# Patient Record
Sex: Female | Born: 1935 | Race: White | Hispanic: No | Smoking: Never smoker
Health system: Southern US, Community
[De-identification: ages and names within clinical notes are randomized; demographics above are authoritative.]

## PROBLEM LIST (undated history)

## (undated) DIAGNOSIS — M79673 Pain in unspecified foot: Secondary | ICD-10-CM

## (undated) DIAGNOSIS — E079 Disorder of thyroid, unspecified: Secondary | ICD-10-CM

## (undated) DIAGNOSIS — F329 Major depressive disorder, single episode, unspecified: Secondary | ICD-10-CM

## (undated) DIAGNOSIS — C439 Malignant melanoma of skin, unspecified: Secondary | ICD-10-CM

## (undated) DIAGNOSIS — G8929 Other chronic pain: Secondary | ICD-10-CM

## (undated) DIAGNOSIS — I639 Cerebral infarction, unspecified: Secondary | ICD-10-CM

## (undated) DIAGNOSIS — H7010 Chronic mastoiditis, unspecified ear: Secondary | ICD-10-CM

## (undated) DIAGNOSIS — I498 Other specified cardiac arrhythmias: Secondary | ICD-10-CM

## (undated) DIAGNOSIS — I10 Essential (primary) hypertension: Secondary | ICD-10-CM

## (undated) DIAGNOSIS — K219 Gastro-esophageal reflux disease without esophagitis: Secondary | ICD-10-CM

## (undated) DIAGNOSIS — F32A Depression, unspecified: Secondary | ICD-10-CM

## (undated) DIAGNOSIS — L309 Dermatitis, unspecified: Secondary | ICD-10-CM

## (undated) DIAGNOSIS — Z950 Presence of cardiac pacemaker: Secondary | ICD-10-CM

## (undated) DIAGNOSIS — T7840XA Allergy, unspecified, initial encounter: Secondary | ICD-10-CM

## (undated) HISTORY — DX: Malignant melanoma of skin, unspecified: C43.9

## (undated) HISTORY — DX: Other chronic pain: G89.29

## (undated) HISTORY — DX: Disorder of thyroid, unspecified: E07.9

## (undated) HISTORY — DX: Pain in unspecified foot: M79.673

## (undated) HISTORY — DX: Chronic mastoiditis, unspecified ear: H70.10

## (undated) HISTORY — DX: Other specified cardiac arrhythmias: I49.8

## (undated) HISTORY — DX: Cerebral infarction, unspecified: I63.9

## (undated) HISTORY — PX: COLON SURGERY: SHX602

## (undated) HISTORY — DX: Presence of cardiac pacemaker: Z95.0

## (undated) HISTORY — DX: Major depressive disorder, single episode, unspecified: F32.9

## (undated) HISTORY — DX: Depression, unspecified: F32.A

## (undated) HISTORY — DX: Dermatitis, unspecified: L30.9

## (undated) HISTORY — DX: Essential (primary) hypertension: I10

## (undated) HISTORY — DX: Allergy, unspecified, initial encounter: T78.40XA

## (undated) HISTORY — DX: Gastro-esophageal reflux disease without esophagitis: K21.9

---

## 1999-08-23 ENCOUNTER — Inpatient Hospital Stay (HOSPITAL_COMMUNITY)
Admission: EM | Admit: 1999-08-23 | Discharge: 1999-09-13 | Payer: Self-pay | Admitting: Physical Medicine & Rehabilitation

## 1999-09-11 ENCOUNTER — Encounter: Payer: Self-pay | Admitting: Physical Medicine & Rehabilitation

## 2000-02-21 ENCOUNTER — Emergency Department (HOSPITAL_COMMUNITY): Admission: EM | Admit: 2000-02-21 | Discharge: 2000-02-21 | Payer: Self-pay | Admitting: Emergency Medicine

## 2000-02-21 ENCOUNTER — Encounter: Payer: Self-pay | Admitting: Emergency Medicine

## 2000-05-18 ENCOUNTER — Inpatient Hospital Stay (HOSPITAL_COMMUNITY): Admission: EM | Admit: 2000-05-18 | Discharge: 2000-05-21 | Payer: Self-pay | Admitting: Neurosurgery

## 2000-05-19 ENCOUNTER — Encounter: Payer: Self-pay | Admitting: Neurosurgery

## 2000-07-15 ENCOUNTER — Other Ambulatory Visit: Admission: RE | Admit: 2000-07-15 | Discharge: 2000-07-15 | Payer: Self-pay | Admitting: Family Medicine

## 2001-04-12 ENCOUNTER — Encounter: Admission: RE | Admit: 2001-04-12 | Discharge: 2001-04-12 | Payer: Self-pay | Admitting: Oncology

## 2001-04-12 ENCOUNTER — Encounter (HOSPITAL_COMMUNITY): Admission: RE | Admit: 2001-04-12 | Discharge: 2001-05-12 | Payer: Self-pay | Admitting: Oncology

## 2001-09-25 ENCOUNTER — Emergency Department (HOSPITAL_COMMUNITY): Admission: EM | Admit: 2001-09-25 | Discharge: 2001-09-25 | Payer: Self-pay | Admitting: *Deleted

## 2001-09-29 ENCOUNTER — Ambulatory Visit (HOSPITAL_COMMUNITY): Admission: RE | Admit: 2001-09-29 | Discharge: 2001-09-29 | Payer: Self-pay | Admitting: Family Medicine

## 2001-11-01 ENCOUNTER — Encounter (HOSPITAL_COMMUNITY): Admission: RE | Admit: 2001-11-01 | Discharge: 2001-12-01 | Payer: Self-pay | Admitting: Oncology

## 2001-11-01 ENCOUNTER — Encounter: Admission: RE | Admit: 2001-11-01 | Discharge: 2001-11-01 | Payer: Self-pay | Admitting: Oncology

## 2002-05-09 ENCOUNTER — Encounter: Admission: RE | Admit: 2002-05-09 | Discharge: 2002-05-09 | Payer: Self-pay | Admitting: Oncology

## 2002-05-09 ENCOUNTER — Encounter (HOSPITAL_COMMUNITY): Admission: RE | Admit: 2002-05-09 | Discharge: 2002-06-08 | Payer: Self-pay | Admitting: Oncology

## 2002-06-10 ENCOUNTER — Encounter: Payer: Self-pay | Admitting: Internal Medicine

## 2002-06-10 ENCOUNTER — Ambulatory Visit (HOSPITAL_COMMUNITY): Admission: RE | Admit: 2002-06-10 | Discharge: 2002-06-10 | Payer: Self-pay | Admitting: Internal Medicine

## 2002-07-09 ENCOUNTER — Inpatient Hospital Stay (HOSPITAL_COMMUNITY): Admission: EM | Admit: 2002-07-09 | Discharge: 2002-07-09 | Payer: Self-pay | Admitting: Emergency Medicine

## 2002-09-15 ENCOUNTER — Encounter: Payer: Self-pay | Admitting: *Deleted

## 2002-09-15 ENCOUNTER — Emergency Department (HOSPITAL_COMMUNITY): Admission: EM | Admit: 2002-09-15 | Discharge: 2002-09-15 | Payer: Self-pay

## 2002-10-20 ENCOUNTER — Ambulatory Visit (HOSPITAL_COMMUNITY): Admission: RE | Admit: 2002-10-20 | Discharge: 2002-10-20 | Payer: Self-pay | Admitting: Family Medicine

## 2002-10-20 ENCOUNTER — Encounter: Payer: Self-pay | Admitting: Family Medicine

## 2002-10-31 ENCOUNTER — Ambulatory Visit (HOSPITAL_COMMUNITY): Admission: RE | Admit: 2002-10-31 | Discharge: 2002-10-31 | Payer: Self-pay | Admitting: Internal Medicine

## 2002-11-07 ENCOUNTER — Encounter (HOSPITAL_COMMUNITY): Admission: RE | Admit: 2002-11-07 | Discharge: 2002-12-07 | Payer: Self-pay | Admitting: Oncology

## 2002-11-07 ENCOUNTER — Encounter: Admission: RE | Admit: 2002-11-07 | Discharge: 2002-11-07 | Payer: Self-pay | Admitting: Oncology

## 2002-12-02 ENCOUNTER — Encounter: Payer: Self-pay | Admitting: Family Medicine

## 2002-12-02 ENCOUNTER — Ambulatory Visit (HOSPITAL_COMMUNITY): Admission: RE | Admit: 2002-12-02 | Discharge: 2002-12-02 | Payer: Self-pay | Admitting: Family Medicine

## 2002-12-19 ENCOUNTER — Emergency Department (HOSPITAL_COMMUNITY): Admission: EM | Admit: 2002-12-19 | Discharge: 2002-12-19 | Payer: Self-pay | Admitting: Emergency Medicine

## 2002-12-19 ENCOUNTER — Encounter: Payer: Self-pay | Admitting: Emergency Medicine

## 2003-03-10 ENCOUNTER — Ambulatory Visit (HOSPITAL_COMMUNITY): Admission: RE | Admit: 2003-03-10 | Discharge: 2003-03-10 | Payer: Self-pay | Admitting: Family Medicine

## 2003-03-10 ENCOUNTER — Encounter: Payer: Self-pay | Admitting: Family Medicine

## 2003-04-25 ENCOUNTER — Ambulatory Visit (HOSPITAL_COMMUNITY): Admission: RE | Admit: 2003-04-25 | Discharge: 2003-04-25 | Payer: Self-pay | Admitting: Family Medicine

## 2003-04-25 ENCOUNTER — Encounter: Payer: Self-pay | Admitting: Family Medicine

## 2003-05-02 ENCOUNTER — Ambulatory Visit (HOSPITAL_COMMUNITY): Admission: RE | Admit: 2003-05-02 | Discharge: 2003-05-02 | Payer: Self-pay | Admitting: Family Medicine

## 2003-05-02 ENCOUNTER — Encounter: Payer: Self-pay | Admitting: Family Medicine

## 2003-05-10 ENCOUNTER — Encounter: Admission: RE | Admit: 2003-05-10 | Discharge: 2003-05-10 | Payer: Self-pay | Admitting: Oncology

## 2003-07-10 ENCOUNTER — Ambulatory Visit (HOSPITAL_COMMUNITY): Admission: RE | Admit: 2003-07-10 | Discharge: 2003-07-10 | Payer: Self-pay | Admitting: Internal Medicine

## 2003-08-04 ENCOUNTER — Inpatient Hospital Stay (HOSPITAL_COMMUNITY): Admission: EM | Admit: 2003-08-04 | Discharge: 2003-08-05 | Payer: Self-pay | Admitting: Emergency Medicine

## 2003-08-09 ENCOUNTER — Ambulatory Visit (HOSPITAL_COMMUNITY): Admission: RE | Admit: 2003-08-09 | Discharge: 2003-08-09 | Payer: Self-pay | Admitting: Family Medicine

## 2003-08-19 ENCOUNTER — Inpatient Hospital Stay (HOSPITAL_COMMUNITY): Admission: EM | Admit: 2003-08-19 | Discharge: 2003-08-22 | Payer: Self-pay | Admitting: Emergency Medicine

## 2003-09-05 ENCOUNTER — Encounter: Payer: Self-pay | Admitting: Internal Medicine

## 2003-12-11 ENCOUNTER — Encounter: Admission: RE | Admit: 2003-12-11 | Discharge: 2003-12-11 | Payer: Self-pay | Admitting: Oncology

## 2003-12-11 ENCOUNTER — Encounter (HOSPITAL_COMMUNITY): Admission: RE | Admit: 2003-12-11 | Discharge: 2004-01-10 | Payer: Self-pay | Admitting: Oncology

## 2004-02-06 ENCOUNTER — Ambulatory Visit (HOSPITAL_COMMUNITY): Admission: RE | Admit: 2004-02-06 | Discharge: 2004-02-06 | Payer: Self-pay | Admitting: Family Medicine

## 2004-02-16 ENCOUNTER — Encounter (HOSPITAL_COMMUNITY): Admission: RE | Admit: 2004-02-16 | Discharge: 2004-03-17 | Payer: Self-pay | Admitting: Family Medicine

## 2004-04-04 ENCOUNTER — Ambulatory Visit (HOSPITAL_COMMUNITY): Admission: RE | Admit: 2004-04-04 | Discharge: 2004-04-04 | Payer: Self-pay | Admitting: Family Medicine

## 2004-06-11 ENCOUNTER — Encounter: Admission: RE | Admit: 2004-06-11 | Discharge: 2004-06-11 | Payer: Self-pay | Admitting: Oncology

## 2004-06-11 ENCOUNTER — Encounter (HOSPITAL_COMMUNITY): Admission: RE | Admit: 2004-06-11 | Discharge: 2004-07-11 | Payer: Self-pay | Admitting: Oncology

## 2004-12-10 ENCOUNTER — Ambulatory Visit (HOSPITAL_COMMUNITY): Payer: Self-pay | Admitting: Oncology

## 2004-12-10 ENCOUNTER — Encounter (HOSPITAL_COMMUNITY): Admission: RE | Admit: 2004-12-10 | Discharge: 2005-01-09 | Payer: Self-pay | Admitting: Oncology

## 2004-12-10 ENCOUNTER — Encounter: Admission: RE | Admit: 2004-12-10 | Discharge: 2004-12-10 | Payer: Self-pay | Admitting: Oncology

## 2005-05-10 ENCOUNTER — Emergency Department (HOSPITAL_COMMUNITY): Admission: EM | Admit: 2005-05-10 | Discharge: 2005-05-10 | Payer: Self-pay | Admitting: Emergency Medicine

## 2005-06-02 ENCOUNTER — Ambulatory Visit: Payer: Self-pay | Admitting: Internal Medicine

## 2005-06-11 ENCOUNTER — Encounter (HOSPITAL_COMMUNITY): Admission: RE | Admit: 2005-06-11 | Discharge: 2005-07-11 | Payer: Self-pay | Admitting: Oncology

## 2005-06-11 ENCOUNTER — Ambulatory Visit (HOSPITAL_COMMUNITY): Payer: Self-pay | Admitting: Oncology

## 2005-06-11 ENCOUNTER — Encounter: Admission: RE | Admit: 2005-06-11 | Discharge: 2005-06-11 | Payer: Self-pay | Admitting: Oncology

## 2005-06-13 ENCOUNTER — Ambulatory Visit (HOSPITAL_COMMUNITY): Admission: RE | Admit: 2005-06-13 | Discharge: 2005-06-13 | Payer: Self-pay | Admitting: Family Medicine

## 2005-07-15 ENCOUNTER — Inpatient Hospital Stay (HOSPITAL_COMMUNITY): Admission: EM | Admit: 2005-07-15 | Discharge: 2005-07-16 | Payer: Self-pay | Admitting: Emergency Medicine

## 2005-08-11 ENCOUNTER — Ambulatory Visit (HOSPITAL_COMMUNITY): Admission: RE | Admit: 2005-08-11 | Discharge: 2005-08-11 | Payer: Self-pay | Admitting: Internal Medicine

## 2005-11-14 ENCOUNTER — Ambulatory Visit (HOSPITAL_COMMUNITY): Admission: RE | Admit: 2005-11-14 | Discharge: 2005-11-14 | Payer: Self-pay | Admitting: Internal Medicine

## 2005-11-25 ENCOUNTER — Ambulatory Visit (HOSPITAL_COMMUNITY): Admission: RE | Admit: 2005-11-25 | Discharge: 2005-11-25 | Payer: Self-pay | Admitting: Family Medicine

## 2005-12-11 ENCOUNTER — Ambulatory Visit (HOSPITAL_COMMUNITY): Payer: Self-pay | Admitting: Oncology

## 2005-12-11 ENCOUNTER — Encounter: Admission: RE | Admit: 2005-12-11 | Discharge: 2005-12-11 | Payer: Self-pay | Admitting: Oncology

## 2005-12-11 ENCOUNTER — Encounter (HOSPITAL_COMMUNITY): Admission: RE | Admit: 2005-12-11 | Discharge: 2006-01-10 | Payer: Self-pay | Admitting: Oncology

## 2006-04-22 ENCOUNTER — Ambulatory Visit: Payer: Self-pay | Admitting: Internal Medicine

## 2006-05-11 ENCOUNTER — Encounter: Admission: RE | Admit: 2006-05-11 | Discharge: 2006-05-29 | Payer: Self-pay | Admitting: Oncology

## 2006-05-28 ENCOUNTER — Ambulatory Visit: Payer: Self-pay | Admitting: Internal Medicine

## 2006-06-10 ENCOUNTER — Encounter (HOSPITAL_COMMUNITY): Admission: RE | Admit: 2006-06-10 | Discharge: 2006-07-10 | Payer: Self-pay | Admitting: Oncology

## 2006-06-10 ENCOUNTER — Encounter: Admission: RE | Admit: 2006-06-10 | Discharge: 2006-06-10 | Payer: Self-pay | Admitting: Oncology

## 2006-06-10 ENCOUNTER — Ambulatory Visit (HOSPITAL_COMMUNITY): Payer: Self-pay | Admitting: Oncology

## 2006-07-06 ENCOUNTER — Ambulatory Visit (HOSPITAL_COMMUNITY): Admission: RE | Admit: 2006-07-06 | Discharge: 2006-07-06 | Payer: Self-pay | Admitting: Internal Medicine

## 2006-07-06 ENCOUNTER — Ambulatory Visit: Payer: Self-pay | Admitting: Internal Medicine

## 2006-09-29 ENCOUNTER — Observation Stay (HOSPITAL_COMMUNITY): Admission: EM | Admit: 2006-09-29 | Discharge: 2006-09-29 | Payer: Self-pay | Admitting: Emergency Medicine

## 2006-10-22 ENCOUNTER — Ambulatory Visit (HOSPITAL_COMMUNITY): Admission: RE | Admit: 2006-10-22 | Discharge: 2006-10-22 | Payer: Self-pay | Admitting: Family Medicine

## 2006-12-09 ENCOUNTER — Encounter (HOSPITAL_COMMUNITY): Admission: RE | Admit: 2006-12-09 | Discharge: 2007-01-08 | Payer: Self-pay | Admitting: Oncology

## 2006-12-09 ENCOUNTER — Ambulatory Visit (HOSPITAL_COMMUNITY): Payer: Self-pay | Admitting: Oncology

## 2007-03-16 ENCOUNTER — Ambulatory Visit (HOSPITAL_COMMUNITY): Admission: RE | Admit: 2007-03-16 | Discharge: 2007-03-16 | Payer: Self-pay | Admitting: Family Medicine

## 2007-04-14 ENCOUNTER — Ambulatory Visit (HOSPITAL_COMMUNITY): Admission: RE | Admit: 2007-04-14 | Discharge: 2007-04-14 | Payer: Self-pay | Admitting: Family Medicine

## 2007-05-07 ENCOUNTER — Encounter (HOSPITAL_COMMUNITY): Admission: RE | Admit: 2007-05-07 | Discharge: 2007-06-01 | Payer: Self-pay | Admitting: Family Medicine

## 2007-05-16 ENCOUNTER — Ambulatory Visit: Payer: Self-pay | Admitting: Cardiology

## 2007-05-18 ENCOUNTER — Inpatient Hospital Stay (HOSPITAL_COMMUNITY): Admission: EM | Admit: 2007-05-18 | Discharge: 2007-05-20 | Payer: Self-pay | Admitting: Emergency Medicine

## 2007-05-20 ENCOUNTER — Ambulatory Visit: Payer: Self-pay | Admitting: Internal Medicine

## 2007-06-28 ENCOUNTER — Encounter (HOSPITAL_COMMUNITY): Admission: RE | Admit: 2007-06-28 | Discharge: 2007-07-28 | Payer: Self-pay | Admitting: Family Medicine

## 2007-06-29 ENCOUNTER — Ambulatory Visit (HOSPITAL_COMMUNITY): Payer: Self-pay | Admitting: Oncology

## 2007-06-29 ENCOUNTER — Encounter (HOSPITAL_COMMUNITY): Admission: RE | Admit: 2007-06-29 | Discharge: 2007-07-29 | Payer: Self-pay | Admitting: Oncology

## 2007-07-24 ENCOUNTER — Emergency Department (HOSPITAL_COMMUNITY): Admission: EM | Admit: 2007-07-24 | Discharge: 2007-07-24 | Payer: Self-pay | Admitting: Emergency Medicine

## 2007-08-09 ENCOUNTER — Encounter (HOSPITAL_COMMUNITY): Admission: RE | Admit: 2007-08-09 | Discharge: 2007-09-01 | Payer: Self-pay | Admitting: Family Medicine

## 2007-09-03 ENCOUNTER — Emergency Department (HOSPITAL_COMMUNITY): Admission: EM | Admit: 2007-09-03 | Discharge: 2007-09-03 | Payer: Self-pay | Admitting: Emergency Medicine

## 2007-12-09 ENCOUNTER — Ambulatory Visit (HOSPITAL_COMMUNITY): Admission: RE | Admit: 2007-12-09 | Discharge: 2007-12-09 | Payer: Self-pay | Admitting: Internal Medicine

## 2008-05-05 ENCOUNTER — Encounter: Payer: Self-pay | Admitting: Internal Medicine

## 2008-07-17 ENCOUNTER — Emergency Department: Payer: Self-pay | Admitting: Emergency Medicine

## 2008-07-20 IMAGING — NM NM MYOCAR MULTI W/ SPECT
1 series · 6 of 6 positions shown · non-contrast
Comparison: none

CLINICAL DATA: 82-year-old patient admitted to the hospital with a fall.   Positive troponins.  Rule out coronary disease. 
 STRESS MYOVIEW:
 140 mcg/kg/minute of Adenosine infused over 4 minutes.  Heart rate was relatively fixed with atrial pacing.  Baseline EKG showed T-wave inversions in the anterior lateral leads.  Patient tolerated the procedure well.  Blood pressure was stable at 120/70. 
 SCINTIGRAPHIC RESULTS:  
 Images were reconstructed in the short axis, vertical, and horizontal axis.  There was some thinning of the inferior apex suggesting a possible small inferior apical wall infarction.  There was no evidence of ischemia.  LV cavity size and function were normal.

[Series 1: cr cardiac tc low dose · 6.52mm/px · 6 of 64 frames shown]
[frame 6/64]
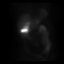
[frame 16/64]
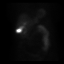
[frame 27/64]
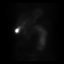
[frame 38/64]
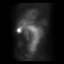
[frame 48/64]
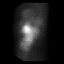
[frame 59/64]
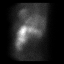

[6 of 6 positions shown; findings below may reference images not displayed]

IMPRESSION: Low risk Myoview study possibly small inferior apical wall infarction.  No evidence of ischemia.  Gated ejection fraction preserved at 59%.  Note should be made that the patient did have pacing during the study.

## 2008-08-22 ENCOUNTER — Emergency Department: Payer: Self-pay | Admitting: Emergency Medicine

## 2008-10-12 ENCOUNTER — Emergency Department: Payer: Self-pay | Admitting: Emergency Medicine

## 2008-11-01 ENCOUNTER — Emergency Department: Payer: Self-pay | Admitting: Emergency Medicine

## 2008-12-05 ENCOUNTER — Emergency Department: Payer: Self-pay | Admitting: Emergency Medicine

## 2008-12-21 ENCOUNTER — Encounter (INDEPENDENT_AMBULATORY_CARE_PROVIDER_SITE_OTHER): Payer: Self-pay | Admitting: *Deleted

## 2008-12-27 ENCOUNTER — Ambulatory Visit: Payer: Self-pay | Admitting: Specialist

## 2008-12-27 ENCOUNTER — Ambulatory Visit: Payer: Self-pay | Admitting: Internal Medicine

## 2009-02-27 DIAGNOSIS — Z950 Presence of cardiac pacemaker: Secondary | ICD-10-CM

## 2009-02-27 DIAGNOSIS — I498 Other specified cardiac arrhythmias: Secondary | ICD-10-CM

## 2009-02-27 DIAGNOSIS — I10 Essential (primary) hypertension: Secondary | ICD-10-CM | POA: Insufficient documentation

## 2009-02-28 ENCOUNTER — Encounter (HOSPITAL_COMMUNITY): Admission: RE | Admit: 2009-02-28 | Discharge: 2009-03-30 | Payer: Self-pay | Admitting: Oncology

## 2009-02-28 ENCOUNTER — Ambulatory Visit (HOSPITAL_COMMUNITY): Payer: Self-pay | Admitting: Oncology

## 2009-03-02 ENCOUNTER — Encounter: Payer: Self-pay | Admitting: Internal Medicine

## 2009-10-05 ENCOUNTER — Emergency Department: Payer: Self-pay | Admitting: Emergency Medicine

## 2009-11-30 ENCOUNTER — Ambulatory Visit: Payer: Self-pay | Admitting: Family

## 2010-01-15 ENCOUNTER — Emergency Department: Payer: Self-pay | Admitting: Emergency Medicine

## 2010-02-28 ENCOUNTER — Ambulatory Visit: Payer: Self-pay | Admitting: Cardiology

## 2010-03-07 ENCOUNTER — Ambulatory Visit: Payer: Self-pay | Admitting: Cardiology

## 2010-03-28 ENCOUNTER — Encounter: Payer: Self-pay | Admitting: Family Medicine

## 2010-04-29 ENCOUNTER — Encounter (INDEPENDENT_AMBULATORY_CARE_PROVIDER_SITE_OTHER): Payer: Self-pay | Admitting: *Deleted

## 2010-05-30 ENCOUNTER — Encounter: Payer: Self-pay | Admitting: Internal Medicine

## 2010-06-12 ENCOUNTER — Emergency Department: Payer: Self-pay | Admitting: Emergency Medicine

## 2010-06-28 ENCOUNTER — Encounter (INDEPENDENT_AMBULATORY_CARE_PROVIDER_SITE_OTHER): Payer: Self-pay | Admitting: *Deleted

## 2010-09-21 ENCOUNTER — Encounter: Payer: Self-pay | Admitting: General Surgery

## 2010-09-21 ENCOUNTER — Encounter: Payer: Self-pay | Admitting: *Deleted

## 2010-09-22 ENCOUNTER — Encounter: Payer: Self-pay | Admitting: Family Medicine

## 2010-10-01 NOTE — Letter (Signed)
Summary: Device-Delinquent Check    [address], [place] [address]   Phone: [phone]  Fax: [phone]     April 29, 2010 MRN: [id number]   Brooke [name] [address] [place], [place]  [address]   Dear [name],  According to our records, you have not had your implanted device checked in the recommended period of time.  We are unable to determine appropriate device function without checking your device on a regular basis.  Please call our office to schedule an appointment, with Dr. Ladona Ridgel in [place],  as soon as possible.  If you are having your device checked by another physician, please call us so that we may update our records.  Thank you,  Altha Harm, LPN  April 29, 2010 8:36 AM  [facility]  certified mail

## 2010-10-01 NOTE — Cardiovascular Report (Signed)
Summary: Certified Letter - Returned  Certified Letter - Returned   Imported By: Debby Freiberg 07/10/2010 14:36:49  _____________________________________________________________________  External Attachment:    Type:   Image     Comment:   External Document

## 2010-10-01 NOTE — Miscellaneous (Signed)
Summary: dx correction  Clinical Lists Changes  Problems: Changed problem from PACEMAKER (ICD-V45..01) to PACEMAKER, PERMANENT (ICD-V45.01)  changed the incorrect dx code to correct dx code 

## 2010-10-08 ENCOUNTER — Encounter: Payer: Self-pay | Admitting: Family Medicine

## 2010-10-08 ENCOUNTER — Encounter (INDEPENDENT_AMBULATORY_CARE_PROVIDER_SITE_OTHER): Payer: Medicare Other | Admitting: Family Medicine

## 2010-10-08 ENCOUNTER — Other Ambulatory Visit: Payer: Self-pay | Admitting: Family Medicine

## 2010-10-08 DIAGNOSIS — B369 Superficial mycosis, unspecified: Secondary | ICD-10-CM | POA: Insufficient documentation

## 2010-10-08 DIAGNOSIS — K219 Gastro-esophageal reflux disease without esophagitis: Secondary | ICD-10-CM | POA: Insufficient documentation

## 2010-10-08 DIAGNOSIS — J309 Allergic rhinitis, unspecified: Secondary | ICD-10-CM | POA: Insufficient documentation

## 2010-10-08 DIAGNOSIS — I10 Essential (primary) hypertension: Secondary | ICD-10-CM

## 2010-10-08 DIAGNOSIS — E039 Hypothyroidism, unspecified: Secondary | ICD-10-CM

## 2010-10-08 DIAGNOSIS — I2510 Atherosclerotic heart disease of native coronary artery without angina pectoris: Secondary | ICD-10-CM

## 2010-10-08 DIAGNOSIS — M810 Age-related osteoporosis without current pathological fracture: Secondary | ICD-10-CM

## 2010-10-08 DIAGNOSIS — F329 Major depressive disorder, single episode, unspecified: Secondary | ICD-10-CM | POA: Insufficient documentation

## 2010-10-08 DIAGNOSIS — B0229 Other postherpetic nervous system involvement: Secondary | ICD-10-CM | POA: Insufficient documentation

## 2010-10-08 DIAGNOSIS — Z8679 Personal history of other diseases of the circulatory system: Secondary | ICD-10-CM | POA: Insufficient documentation

## 2010-10-08 DIAGNOSIS — M79609 Pain in unspecified limb: Secondary | ICD-10-CM | POA: Insufficient documentation

## 2010-10-08 DIAGNOSIS — E785 Hyperlipidemia, unspecified: Secondary | ICD-10-CM

## 2010-10-08 LAB — CBC WITH DIFFERENTIAL/PLATELET
Basophils Relative: 0.5 % (ref 0.0–3.0)
HCT: 36 % (ref 36.0–46.0)
Monocytes Relative: 9.5 % (ref 3.0–12.0)
RDW: 13.6 % (ref 11.5–14.6)
WBC: 4.9 10*3/uL (ref 4.5–10.5)

## 2010-10-08 LAB — HEPATIC FUNCTION PANEL
ALT: 17 U/L (ref 0–35)
AST: 34 U/L (ref 0–37)
Alkaline Phosphatase: 60 U/L (ref 39–117)
Bilirubin, Direct: 0.1 mg/dL (ref 0.0–0.3)
Total Bilirubin: 0.5 mg/dL (ref 0.3–1.2)

## 2010-10-08 LAB — LDL CHOLESTEROL, DIRECT: Direct LDL: 121.4 mg/dL

## 2010-10-08 LAB — RENAL FUNCTION PANEL
CO2: 32 mEq/L (ref 19–32)
Creatinine, Ser: 0.8 mg/dL (ref 0.4–1.2)
Potassium: 3.8 mEq/L (ref 3.5–5.1)

## 2010-10-08 LAB — LIPID PANEL
Cholesterol: 206 mg/dL — ABNORMAL HIGH (ref 0–200)
Total CHOL/HDL Ratio: 3
Triglycerides: 134 mg/dL (ref 0.0–149.0)

## 2010-10-08 LAB — TSH: TSH: 1.97 u[IU]/mL (ref 0.35–5.50)

## 2010-10-11 LAB — CONVERTED CEMR LAB: Vit D, 25-Hydroxy: 53 ng/mL (ref 30–89)

## 2010-10-17 NOTE — Assessment & Plan Note (Signed)
Summary: pt to re-establish/dlo   Vital Signs:  Patient profile:   77 year old female Height:      58 inches Weight:      112.50 pounds BMI:     23.60 Temp:     97.6 degrees F oral Pulse rate:   72 / minute Pulse rhythm:   regular BP sitting:   128 / 76  (left arm) Cuff size:   regular  Vitals Entered By: Lewanda Rife LPN (October 08, 2010 11:29 AM) CC: to reestablish   History of Present Illness: moved back to [facility] - to be closer to family  saw Dr Sullivan Lone for a while -- she did not have good trust with him   has had bradycardia and pacemaker since last visit doing well with that   is having problems with her feet  hurts to walk on them  her last doctor took the "wrong bone " out of toe -- at [facility]  then saw Dr Orland Jarred in [place] - removed corn from her R foot - that was painful  is wearing good shoes   is walking with a walker because of feet   rash on breast -- is really itchy  it is burning  has had the rash for a year  ? from chemo after her colon cancer   is taking thyroid medicine  has not checked labs for a long time    Preventive Screening-Counseling & Management  Caffeine-Diet-Exercise     Does Patient Exercise: no  Allergies (verified): 1)  ! Penicillin 2)  ! Sulfa 3)  ! Tretinoin (Chemotherapy) (Tretinoin (Chemotherapy)) 4)  ! Darvocet 5)  ! Pneumovax 23 (Pneumococcal Vac Polyvalent)  Past History:  Family History: Last updated: 10/08/2010 Father: alcoholism heart disease brother Alcoholism  Social History: Last updated: 10/08/2010 Widowed  retired  moved to [place] - closer to family  Tobacco Use - No.  Alcohol Use - no Drug Use - no Regular exercise-no son Kathlene November takes care of him  lives in senior apartments that she really likes   Risk Factors: Exercise: no (10/08/2010)  Risk Factors: Smoking Status: never (02/27/2009)  Past Medical History: BRADYCARDIA (ICD-427.89) HYPERTENSION, UNSPECIFIED  (ICD-401.9) PACEMAKER (ICD-V45.Marland Kitchen01) Thyroid problem Allergic rhinitis Depression GERD Cerebrovascular accident, hx of past hx of colon cancer osteoporosis  ? chronic mastoiditis  seasonal all rhinitis  eczema  hearing loss - with hearing aides  chronic foot pain  ? melanoma - pt disputes this    [facility]ar[facility]- [facility] - Dr Hermelinda Medicus cardiology- Ladona Ridgel for pacer , sees Dr Gwen Pounds for general cardiol  Past Surgical History: PCM -  [employer]  Kappa 900 - 03/2001 Colon CA with surgery 2000 Stage 4 Melanoma surgery at [facility] 2010  Family History: Father: alcoholism heart disease brother Alcoholism  Social History: Widowed  retired  moved to [place] - closer to family  Tobacco Use - No.  Alcohol Use - no Drug Use - no Regular exercise-no son Kathlene November takes care of him  lives in senior apartments that she really likes  Does Patient Exercise:  no  Review of Systems General:  Complains of fatigue; denies chills, fever, loss of appetite, and malaise. Eyes:  Denies blurring and eye irritation. ENT:  Denies sinus pressure and sore throat. CV:  Denies chest pain or discomfort, lightheadness, and palpitations. Resp:  Denies cough, shortness of breath, and wheezing. GI:  Denies abdominal pain, change in bowel habits, hemorrhoids, and indigestion. GU:  Denies urinary frequency. MS:  Complains of joint pain and stiffness. Derm:  Denies itching, lesion(s), poor wound healing, and rash. Neuro:  Denies numbness and tingling. Psych:  Denies anxiety and depression. Endo:  Denies cold intolerance, excessive thirst, excessive urination, and heat intolerance. Heme:  Denies abnormal bruising and bleeding.  Physical Exam  General:  frail appearing elderly female with walker  Head:  normocephalic, atraumatic, and no abnormalities observed.   Eyes:  vision grossly intact, pupils equal, pupils round, pupils reactive to light, and no injection.   Ears:  R ear normal and L ear  normal.   is hard of hearing despite hearing aids -- have to yell to be heard  Nose:  no nasal discharge.   Mouth:  pharynx pink and moist.   Neck:  supple with full rom and no masses or thyromegally, no JVD or carotid bruit  Chest Wall:  No deformities, masses, or tenderness  pacer noted  Breasts:  no lumps vague light redness under R breast without satellite lesions also some faint scarring from old pinpoint/ papule like lesions? (looks like healed shingles) Lungs:  scant distant bs and crackles at bases good air exch  no wheeze Heart:  RRR no M Abdomen:  protuberant and soft and nt no renal bruits  Msk:  No deformity or scoliosis noted of thoracic or lumbar spine.  no acute joint changes  bilateral foot deformity with first toe overlapping 2nd  slow gait  bunions and callouses noted  Pulses:  R and L carotid,radial,femoral,dorsalis pedis and posterior tibial pulses are full and equal bilaterally Extremities:  No clubbing, cyanosis, edema, or deformity noted with normal full range of motion of all joints.   Neurologic:  sensation intact to light touch and DTRs symmetrical and normal.   Skin:  see breast exam no other rash or skin changes  Cervical Nodes:  No lymphadenopathy noted Inguinal Nodes:  No significant adenopathy Psych:  normal affect, talkative and pleasant    Impression & Recommendations:  Problem # 1:  POSTHERPETIC NEURALGIA (ICD-053.19) Assessment New I have not doubt that pt had shingles under her R breast 1 year ago and now is suffering from post herpetic neuralgia in addition - some redness that could be yeast will tx with nystatin  pt not interested in further tx for pain but I did give her a handout from [facility] on shingles and phn-- we will discuss shingles vaccine in future   Problem # 2:  FOOT PAIN, CHRONIC (ICD-729.5) Assessment: New much deformity/ toe overlap and arthritis few bunions  will refer to pod to disc options for shoe wear that would be more  comfortable Orders: Podiatry Referral (Podiatry)  Problem # 3:  HYPOTHYROIDISM (ICD-244.9) Assessment: New no clinical changes, but this is new per pt - has not been checked since starting med tsh today and update Her updated medication list for this problem includes:    Levothroid 75 Mcg Tabs (Levothyroxine sodium) .Marland Kitchen... 1/2 tablet by mouth daily  Orders: Venipuncture ([address]) TLB-Lipid Panel (80061-LIPID) TLB-Renal Function Panel (80069-RENAL) TLB-CBC Platelet - w/Differential (85025-CBCD) TLB-Hepatic/Liver Function Pnl (80076-HEPATIC) TLB-TSH (Thyroid Stimulating Hormone) (84443-TSH) T-Vitamin D (25-Hydroxy) ([address]11)  Problem # 4:  OSTEOPOROSIS (ICD-733.00) Assessment: New pt thinks she is up to date on dexa- unsure  will send for that  also disc ca and D currently on no meds will disc further at f/u Orders: Venipuncture (91478) TLB-Lipid Panel (80061-LIPID) TLB-Renal Function Panel (80069-RENAL) TLB-CBC Platelet - w/Differential (85025-CBCD) TLB-Hepatic/Liver Function Pnl (80076-HEPATIC) TLB-TSH (Thyroid Stimulating Hormone) (84443-TSH)  T-Vitamin D (25-Hydroxy) [phone])  Problem # 5:  CORONARY ARTERY DISEASE (ICD-414.00) Assessment: New per pt this is in good control after pacemaker with no problems sent for last cardiol note also checking lipids and also renal in light of diuretics  Her updated medication list for this problem includes:    Aspirin 81 Mg Tabs (Aspirin) .Marland Kitchen... Take 1 tablet by mouth once a day    Furosemide 20 Mg Tabs (Furosemide) .Marland Kitchen... Take 1 tablet by mouth once a day as needed for fluid    Hydrochlorothiazide 25 Mg Tabs (Hydrochlorothiazide) .Marland Kitchen... Take 1 tablet by mouth once a day    Metoprolol Succinate 25 Mg Xr24h-tab (Metoprolol succinate) .Marland Kitchen... 1/2 tablet by mouth daily at dinner  Orders: Venipuncture ([address]) TLB-Lipid Panel (80061-LIPID) TLB-Renal Function Panel (80069-RENAL) TLB-CBC Platelet - w/Differential  (85025-CBCD) TLB-Hepatic/Liver Function Pnl (80076-HEPATIC) TLB-TSH (Thyroid Stimulating Hormone) (84443-TSH) T-Vitamin D (25-Hydroxy) ([address]-29[address])  Problem # 6:  PACEMAKER, PERMANENT (ICD-V45.01) Assessment: Comment Only rev notes from Dr Ladona Ridgel sounds like good LV function   Problem # 7:  Preventive Health Care (ICD-V70.0) Assessment: Comment Only sent for last primary care note/ labs/ mam and dexa and imms  will rev at f/u  Complete Medication List: 1)  Multivitamins Tabs (Multiple vitamin) .... Take 1 tablet by mouth once a day 2)  Ibuprofen 200 Mg Tabs (Ibuprofen) .... Otc as directed. 3)  Aspirin 81 Mg Tabs (Aspirin) .... Take 1 tablet by mouth once a day 4)  Clotrimazole 1 % Crea (Clotrimazole) .... As needed for rash 5)  Loratadine 10 Mg Tabs (Loratadine) .... Take 1 tablet by mouth once a day 6)  Furosemide 20 Mg Tabs (Furosemide) .... Take 1 tablet by mouth once a day as needed for fluid 7)  Klor-con 10 10 Meq Cr-tabs (Potassium chloride) .... Take 1 tablet by mouth once a day as needed when take furosemide 8)  Meclizine Hcl 25 Mg Tabs (Meclizine hcl) .... One tablet by mouth twice a day as needed for dizziness 9)  Hydrochlorothiazide 25 Mg Tabs (Hydrochlorothiazide) .... Take 1 tablet by mouth once a day 10)  Metoprolol Succinate 25 Mg Xr24h-tab (Metoprolol succinate) .... 1/2 tablet by mouth daily at dinner 11)  Levothroid 75 Mcg Tabs (Levothyroxine sodium) .... 1/2 tablet by mouth daily 12)  Nystatin 100000 Unit/gm Crea (Nystatin) .... Apply to affected area under breast for rash two times a day fprn  Other Orders: Prescription Created Electronically [id number])  Patient Instructions: 1)  I think you had shingles a year ago under breast that has caused burning (called post herpetic neuralgia )  2)  also you may have some yeast - so try the nystatin cream and keep the area cool and dry  3)  please send to Dr Sullivan Lone for last progress note 4)  also last mammogram and dexa  and immunizations  5)  please send to Dr Gwen Pounds for last cardiology progress note  6)  labs today 7)  follow up with me in 1 -2 months - we will review your labs  8)  we will refer you to Dr Al Corpus (podiatrist ) at check out  Prescriptions: NYSTATIN [id number] UNIT/GM CREA (NYSTATIN) apply to affected area under breast for rash two times a day fprn  #1 medium x 0   Entered and Authorized by:   Judith Part MD   Signed by:   Judith Part MD on 10/08/2010   Method used:   [facility]ronically to        [facility]       [address]       [place], [place]  [address]       Ph: [phone]       Fax: [phone]   RxID:   [id number]    Orders Added: 1)  Venipuncture [36415] 2)  TLB-Lipid Panel [80061-LIPID] 3)  TLB-Renal Function Panel [80069-RENAL] 4)  TLB-CBC Platelet - w/Differential [85025-CBCD] 5)  TLB-Hepatic/Liver Function Pnl [80076-HEPATIC] 6)  TLB-TSH (Thyroid Stimulating Hormone) [84443-TSH] 7)  T-Vitamin D (25-Hydroxy) [84132-44010] 8)  Podiatry Referral [Podiatry] 9)  Prescription Created Electronically [G8553] 10)  New Patient Level IV [27253]    Current Allergies (reviewed today): ! PENICILLIN ! SULFA ! TRETINOIN (CHEMOTHERAPY) (TRETINOIN (CHEMOTHERAPY)) ! DARVOCET ! PNEUMOVAX 23 (PNEUMOCOCCAL VAC POLYVALENT)

## 2010-10-17 NOTE — Letter (Signed)
Summary: Patient Questionnaire  Patient Questionnaire   Imported By: Beau Fanny 10/08/2010 15:07:11  _____________________________________________________________________  External Attachment:    Type:   Image     Comment:   External Document

## 2010-10-23 ENCOUNTER — Encounter: Payer: Self-pay | Admitting: Family Medicine

## 2010-11-06 ENCOUNTER — Encounter: Payer: Self-pay | Admitting: Family Medicine

## 2010-11-19 NOTE — Miscellaneous (Signed)
Summary: [facility]   Imported By: Kassie Mends 11/13/2010 09:04:56  _____________________________________________________________________  External Attachment:    Type:   Image     Comment:   External Document

## 2010-11-28 ENCOUNTER — Encounter: Payer: Self-pay | Admitting: Family Medicine

## 2010-12-09 ENCOUNTER — Ambulatory Visit (INDEPENDENT_AMBULATORY_CARE_PROVIDER_SITE_OTHER): Payer: Medicare Other | Admitting: Family Medicine

## 2010-12-09 ENCOUNTER — Encounter: Payer: Self-pay | Admitting: Family Medicine

## 2010-12-09 DIAGNOSIS — K219 Gastro-esophageal reflux disease without esophagitis: Secondary | ICD-10-CM

## 2010-12-09 DIAGNOSIS — I10 Essential (primary) hypertension: Secondary | ICD-10-CM

## 2010-12-09 DIAGNOSIS — E039 Hypothyroidism, unspecified: Secondary | ICD-10-CM

## 2010-12-09 DIAGNOSIS — E785 Hyperlipidemia, unspecified: Secondary | ICD-10-CM

## 2010-12-09 LAB — COMPREHENSIVE METABOLIC PANEL
ALT: 18 U/L (ref 0–35)
Albumin: 4.1 g/dL (ref 3.5–5.2)
Alkaline Phosphatase: 58 U/L (ref 39–117)
BUN: 17 mg/dL (ref 6–23)
Chloride: 102 mEq/L (ref 96–112)
Glucose, Bld: 98 mg/dL (ref 70–99)
Potassium: 3.7 mEq/L (ref 3.5–5.1)
Sodium: 139 mEq/L (ref 135–145)
Total Bilirubin: 0.6 mg/dL (ref 0.3–1.2)
Total Protein: 7.8 g/dL (ref 6.0–8.3)

## 2010-12-09 LAB — DIFFERENTIAL
Basophils Absolute: 0 10*3/uL (ref 0.0–0.1)
Basophils Relative: 1 % (ref 0–1)
Eosinophils Absolute: 0.1 10*3/uL (ref 0.0–0.7)
Monocytes Absolute: 0.4 10*3/uL (ref 0.1–1.0)
Monocytes Relative: 8 % (ref 3–12)

## 2010-12-09 LAB — CBC
MCHC: 35.8 g/dL (ref 30.0–36.0)
Platelets: 261 10*3/uL (ref 150–400)
RDW: 13.5 % (ref 11.5–15.5)

## 2010-12-09 MED ORDER — RANITIDINE HCL 150 MG PO TABS: 150.0000 mg | ORAL_TABLET | Freq: Two times a day (BID) | ORAL | Status: DC

## 2010-12-09 NOTE — Assessment & Plan Note (Signed)
This is very well controlled [place] of [address] No problems or side effects  No change in med For cardiol check upcoming

## 2010-12-09 NOTE — Progress Notes (Signed)
Subjective:    Patient ID: Brooke [name], female    DOB: [birth year], 77 y.o.   MRN: [id number]  HPI Here for f/u of HTN and hypothyroidism and OP and gerd and lipids   HTN in good control with 110/70 No headaches or med problems   Thyroid- stable tsh Clinically- no changes  No hair or skin changes Energy level is the same   GERD-- is bothering her  Last week stomach hurt and got really nauseated  No diarrhea  Got some mylanta -- that tends to work the best  Heartburn a lot anyway - almost every day Worse with certain foods  Worse at night when she lies down      OP- vit D level is good at 53 No fractures or change in posture Walks with a walker for safety   LDL was 121 in lipids - no meds  Eats little in the way of fatty foods No red meat   Cardiologist  Will see Dr Gwen Pounds -- and ask if she needs to be on chol med   Past Medical History  Diagnosis Date  . Other specified cardiac dysrhythmias     bradycardia  . Hypertension   . Cardiac pacemaker in situ   . Thyroid disease   . Allergy   . Depression   . GERD (gastroesophageal reflux disease)   . Cerebrovascular accident   . Osteoporosis   . Mastoiditis, chronic   . Eczema   . Hearing loss   . Chronic foot pain   . Melanoma     Past Surgical History  Procedure Date  . Colon surgery     History   Social History  . Marital Status: Widowed    Spouse Name: N/A    Number of Children: N/A  . Years of Education: N/A   Occupational History  . Retired    Social History Main Topics  . Smoking status: Never Smoker   . Smokeless tobacco: Not on file  . Alcohol Use: No  . Drug Use: No  . Sexually Active: Not on file   Other Topics Concern  . Not on file   Social History Narrative  . No narrative on file    Family History  Problem Relation Age of Onset  . Alcohol abuse Father   . Heart disease Father   . Alcohol abuse Brother         Review of Systems  Constitutional: Negative for  appetite change and fatigue.  HENT: Positive for hearing loss.   Eyes: Negative for pain and visual disturbance.  Respiratory: Negative for cough.   Cardiovascular: Negative.   Gastrointestinal: Positive for abdominal pain. Negative for diarrhea and blood in stool.  Genitourinary: Negative for urgency and frequency.  Musculoskeletal: Negative for arthralgias and gait problem.  Skin: Negative for color change and rash.  Neurological: Negative for light-headedness, numbness and headaches.  Psychiatric/Behavioral: Negative for dysphoric mood. The patient is not nervous/anxious.        Objective:   Physical Exam  Constitutional: She appears well-developed and well-nourished. No distress.       Frail / elderly female in no distress   HENT:  Head: Normocephalic and atraumatic.  Mouth/Throat: Oropharynx is clear and moist.  Eyes: Conjunctivae and EOM are normal. Pupils are equal, round, and reactive to light.  Neck: No JVD present. No thyromegaly present.  Cardiovascular: Normal rate and regular rhythm.   Pulmonary/Chest: Effort normal and breath sounds normal. She has no wheezes.  Abdominal: Soft. She exhibits distension. She exhibits no mass. There is no rebound.       Abdomen is slightly bloated  Tender in epigastric area to deep palp only  Musculoskeletal: She exhibits no edema.  Lymphadenopathy:    She has no cervical adenopathy.  Neurological: She displays normal reflexes. No cranial nerve deficit.  Skin: Skin is warm and dry. No erythema.  Psychiatric: She has a normal mood and affect.          Assessment & Plan:

## 2010-12-09 NOTE — Assessment & Plan Note (Signed)
This is stable -- with theraputic tsh and no clinical change  Rev labs with pt in detail

## 2010-12-09 NOTE — Assessment & Plan Note (Signed)
Needs to be treated with intermittent symptoms of gastritis and heartburn - but no swallowing problems Will start with zantac 150 bid and update  Disc foods to avoid  Also disc of avoiding any nsaids except her baby asa daily  F/u in 3 months

## 2010-12-09 NOTE — Patient Instructions (Signed)
Stop the ibuprofen  The only safe thing you can take for pain is tylenol  Start zantac 150 mg 1 pill twice daily (this is for heartburn and stomach pain -- keep taking it) When you see Dr Gwen Pounds (cardiology)- please show him a copy of your labs and talk to him about cholesterol - he may want to put you on medicine  Follow up with me in about 3 months

## 2010-12-09 NOTE — Assessment & Plan Note (Signed)
This is very mild with LDL of 121 -- but in light of heart dz history - I urged her to disc further with Dr Gwen Pounds to see if he thinks she needs a statin  Disc low sat fat diet  Rev diet in detail

## 2011-01-11 ENCOUNTER — Emergency Department: Payer: Self-pay | Admitting: Emergency Medicine

## 2011-01-13 ENCOUNTER — Telehealth: Payer: Self-pay | Admitting: *Deleted

## 2011-01-13 NOTE — Telephone Encounter (Signed)
Patient had to go to [facility] because of diarrhea over the weekend. Patient was told that she had a slight UTI and was given Cipro. Patient needs a refill on her Ranitidine if you think that she needs to continue to take this. Pharmacy[facility], [place]. Records show that this was refilled in April with refills.

## 2011-01-13 NOTE — Telephone Encounter (Signed)
Thanks for the update Continue the zantac She should have refils F/u if no improvment

## 2011-01-13 NOTE — Telephone Encounter (Signed)
Patient's son Amyre Segundo (pt had given his phone # due to problems with pt's phone #) notified as instructed by telephone.

## 2011-01-14 ENCOUNTER — Telehealth: Payer: Self-pay | Admitting: *Deleted

## 2011-01-14 NOTE — Op Note (Signed)
NAMELORALI, KHAMIS                ACCOUNT NO.:  [id number]   MEDICAL RECORD NO.:  [id number]          PATIENT TYPE:  AMB   LOCATION:  DAY                           FACILITY:  [facility]   PHYSICIAN:  Lionel December, M.D.    DATE OF BIRTH:  1925/02/17   DATE OF PROCEDURE:  12/09/2007  DATE OF DISCHARGE:  12/09/2007                               OPERATIVE REPORT   SURGEON:  Lionel December, MD   INDICATION:  Ms. Overholt is an 77 year old Caucasian female with  unexplained diarrhea.  She has had right hemicolectomy for carcinoma few  years ago and at risk for small bowel bacterial overgrowth.   PREPROCEDURE EVALUATION:  The patient followed instructions per  protocol.   The patient was given lactulose 25 grams p.o.   RESULTS:  She had 13 parts per million of hydrogen at 30 minutes, 18  parts per million at 60 minutes, and 15 parts per million at 90 minutes.   IMPRESSION:  Hydrogen level in alveolar breath sample not consistent  with small bowel bacterial overgrowth.  Test can may be false negative  with non-hydrogen-producing microorganisms.   RECOMMENDATIONS:  The patient will continue Questran and loperamide, and  she will return for OV in few weeks.      Lionel December, M.D.  Electronically Signed     NR/MEDQ  D:  12/26/2007  T:  12/27/2007  Job:  [id number]   cc:   Patrica Duel, M.D.  Fax: [phone]

## 2011-01-14 NOTE — Telephone Encounter (Signed)
Advised pt's son, Kathlene November.

## 2011-01-14 NOTE — Telephone Encounter (Signed)
I agree - just take 250 mg bid for 3 days instead of 500 Let me know if she is still intolerant of it or if uti symptoms do not improve

## 2011-01-14 NOTE — Consult Note (Signed)
Brooke [name], Brooke [name]                ACCOUNT NO.:  [id number]   MEDICAL RECORD NO.:  [id number]          PATIENT TYPE:  OBV   LOCATION:  A215                          FACILITY:  [facility]   PHYSICIAN:  Gerrit Friends. Dietrich Pates, MD, FACCDATE OF BIRTH:  17-May-1925   DATE OF CONSULTATION:  05/17/2007  DATE OF DISCHARGE:                                 CONSULTATION   PRIMARY CARE PHYSICIAN:  Dr. Nobie Putnam.   PRIMARY CARDIOLOGIST:  Dr. Lewayne Bunting.   HISTORY OF PRESENT ILLNESS:  An 77 year old woman with conduction system  disease and previous placement of a pacemaker, now referred due to  abnormal cardiac markers.  Brooke [name] underwent cardiac catheterization  in September 2001 revealing mild coronary disease with insignificant  lesions in the LAD and circumflex marginals as well as a 50% stenosis in  the mid RCA.  She was asymptomatic with respect to this issue and was  treated medically; however, she presented with high degree AV block at  that time and required placement of a pacemaker.  She has had no  significant cardiac problems since those procedures.  A duplex study of  the carotids was performed in December 2000.  There was no appreciable  atherosclerosis at that time.  The patient is now admitted after a fall  at home in her bathtub.  She has a clear recollection of the exact  mechanism.  There is no suggestion of loss of consciousness; however,  she was unable to arise after falling and spent 5 hours in an  uncomfortable position.  Nonetheless, initial radiographic studies at  the hospital are negative.  She has no marked discomfort, although she  has some pelvic pain.  Cardiac markers were obtained and revealed mild  elevation of CPK and CPK-MB consistent with muscle injury, but a  moderate elevation of troponin to a value of 1.07.  The patient denies  any chest discomfort or dyspnea.  Coronary disease was last assessed in  December 2004 with a pharmacologic stress nuclear study, which  was  entirely negative except for wall motion abnormalities thought related  to pacing.   PAST MEDICAL HISTORY:  Otherwise, notable for a questionable remote  history of CVA occurring in 1999 with minimal, if any, residual  disability.  There is a history of GERD.  She underwent partial  colectomy in 1999 for carcinoma of the colon.  She had no evidence of  disease on reevaluation in November 2004.   MEDICATIONS:  Recent medications include:  1. Metoprolol 25 mg daily.  2. Hydrochlorothiazide 25 mg daily.  3. Fexofenadine 180 mg daily.  4. Aspirin 81 mg daily.  5. Ativan 1 mg t.i.d.  6. Prevacid 30 mg daily.  7. KCl 10 mEq daily.   ALLERGIES:  MULTIPLE ALLERGIES TO MEDICATIONS ARE REPORTED INCLUDING  PENICILLIN, SULFA, PROPOXYPHENE AND DIPHENHYDRAMINE.   SOCIAL HISTORY:  No use of tobacco products nor alcohol.  Resides in  [place] alone with the assistance of family members.   FAMILY HISTORY:  No prominent coronary disease.   REVIEW OF SYSTEMS:  Patient has some instability of gait and  walks with  a cane.  She has impaired hearing acuity.  She describes occasional  dizziness.  She follows a regular diet.  She has had some chronic back  pain due to osteoporosis and DJD of the spine.  Her vision is impaired  requiring corrective lenses for both near and far vision.  She has  complete dentures.  There is a history of anemia.  All other systems  reviewed and are negative.   PHYSICAL EXAMINATION:  GENERAL:  Pleasant woman who is clearly hard of  hearing and in no acute distress.  VITAL SIGNS:  Temperature is 98.1, heart rate 62 and regular,  respirations 20, blood pressure 105/55, weight 55.1 kg.  HEENT:  Anicteric sclerae; normal lids and conjunctiva; EOMs full;  normal oral mucosa.  Decreased air conduction and bone conduction.  SKIN:  Pallor.  NECK:  Mild jugular venous distention; normal carotid upstrokes without  bruits.  LUNGS:  Decreased breath sounds at the bases.   CARDIAC:  Normal first and second heart sounds; fourth heart sound  present.  ABDOMEN:  Soft and nontender; no organomegaly.  EXTREMITIES:  Normal distal pulses; no edema.  NEUROMUSCULAR:  Symmetric strength and tone; normal cranial nerves.   STUDIES:  EKG:  Atrial pacing; diffuse T-wave inversion, which is  unchanged from January 2008.   CHEST X-RAY:  No report available.   LABORATORY DATA:  Other laboratory notable for mild anemia with  hemoglobin of 12, hematocrit of 34.4, normal MCV.  Metabolic profile was  normal.  CPK was 227 with MB of 9.2 and troponin of 1.07.   IMPRESSION:  Brooke [name] presents with noncardiac issues, but cardiac  markers are abnormal including a moderately elevated troponin.  Her  slight elevation of CPK and CPK-MB could be attributed to muscle trauma  and rhabdomyolysis due to prolonged immobility; however, no increase in  troponin would be expected, certainly not one to this level.  There are  no acute changes on her EKG.  She has no symptoms to suggest an acute  coronary syndrome.  For now, we will obtain an echocardiogram to rule  out a segmental wall motion abnormality.  Serial cardiac markers and  EKGs will be obtained.  Based upon those results, we can determine  whether she will require additional testing for ischemic heart disease.  The patient was scheduled for assessment of her pacemaker within the  next few days.  We will go ahead and perform this while she is in  hospital and be happy to follow this nice woman with you.      Gerrit Friends. Dietrich Pates, MD, [facility]  Electronically Signed     RMR/MEDQ  D:  05/17/2007  T:  05/18/2007  Job:  [id number]

## 2011-01-14 NOTE — Discharge Summary (Signed)
Brooke [name], Brooke [name]                ACCOUNT NO.:  [id number]   MEDICAL RECORD NO.:  [id number]          PATIENT TYPE:  INP   LOCATION:  A215                          FACILITY:  [facility]   PHYSICIAN:  Skeet Latch, DO    DATE OF BIRTH:  10-14-1924   DATE OF ADMISSION:  05/16/2007  DATE OF DISCHARGE:  09/18/2008LH                               DISCHARGE SUMMARY   DISCHARGE DIAGNOSES:  1. Non-ST myocardial infarction.  2. Urinary tract infection.  3. Status post fall.  4. Hypertension.  5. History of arrhythmia, with pacemaker.  6. History of cerebrovascular accident.  7. History of gastroesophageal reflux disease.   BRIEF HOSPITAL COURSE:  Brooke [name] is an 77 year old Caucasian female  who presented after having a fall.  Apparently, the patient was trying  to move some things at home and she fell down, but denied any dizziness,  syncope, or palpitations.  Apparently, the patient could not get up and  apparently stayed on the floor for quite some time and was found and  brought to the emergency room for evaluation.  The patient denied any  chest pain, lightheadedness, dizziness, or any other symptoms.  The  patient does live alone at home, without any help, and usually can do  her daily activities on her own.  On initial workup, the patient had a  lumbar spine x-ray that showed no acute finding.  It showed osteopenia  and some early lower lumbar facet DJD and mild L2-L3 degenerative disc  disease.  She also had a pelvic x-ray that showed no acute findings,  bilaterally hip DJD.  She had a CT of her head that showed no acute  intracranial findings.  It showed old right MCA distribution infarct and  chronic microvascular white matter disease.  She also had a CT of her  spine that also showed no acute findings and cervical spondylosis.  Due  to the patient's history, a cardiac consult was obtained.  Cardiology  felt that her slight elevation of her CPK and CK-MB could be attributed  to  muscle trauma and rhabdomyolysis due to prolonged immobility.  The  patient had an echocardiogram performed which showed no acute changes.  The patient was found to have a urinary tract infection and was placed  on oral antibiotics.  The patient was placed on her home medications for  her hypertension, and the patient was placed on DVT as well as GI  prophylaxis.  The patient's status improved as well as her labs, and on  May 20, 2007 it was felt that the patient was stable for  discharge.   DISCHARGE VITAL SIGNS:  Temperature is 97.9, pulse 59, respirations 22,  blood pressure 111/62.  She is saturating 94% on room air.   LABS ON DISCHARGE:  Sodium 142, potassium 3.9, chloride 103, CO2 33,  glucose 122, BUN 12, creatinine 0.89.  White count 5.6, hemoglobin 12,  hematocrit 34.6, platelets 347.  Final urine culture showed multiple  bacterial morphotypes present, none predominant.   MEDICATIONS ON DISCHARGE:  1. Hydrochlorothiazide 25 mg daily.  2. Allegra 180 mg  daily.  3. Gentamicin sulfate ointment onto her eyes 3 times a day.  4. Aspirin 81 mg daily.  5. Multivitamin daily.  6. Citracal tablet 1 tablet daily.  7. Ativan 1 mg 3 times a day p.r.n.  8. Prevacid 30 mg daily.  9. Klor-Con 10 mEq daily.  10.Metoprolol 12.5 mg daily.   CONDITION AT DISCHARGE:  Stable.   The patient was discharged to home.   INSTRUCTIONS:  The patient is to maintain a low-salt, heart-healthy  diet.  The patient is to increase activity slowly.  The patient is to  follow up with Dr. Nobie Putnam in 1 week.  The patient was to have her  pacemaker checked by [facility] on the same day of discharge.   I had a long discussion with her son regarding the patient and her  multiple falls, and the patient is concerned about if it is secondary to  her benzodiazepine abuse.  I told son that he would have to adjust this  with Dr. Nobie Putnam regarding her use of her benzodiazepines.      Skeet Latch, DO  Electronically Signed     SM/MEDQ  D:  05/23/2007  T:  05/24/2007  Job:  [id number]   cc:   Patrica Duel, M.D.  Fax: [phone]

## 2011-01-14 NOTE — Procedures (Signed)
Brooke [name], Brooke [name]                ACCOUNT NO.:  [id number]   MEDICAL RECORD NO.:  [id number]          PATIENT TYPE:  OBV   LOCATION:  A215                          FACILITY:  [facility]   PHYSICIAN:  Gerrit Friends. Dietrich Pates, MD, FACCDATE OF BIRTH:  1925-04-07   DATE OF PROCEDURE:  05/18/2007  DATE OF DISCHARGE:                                ECHOCARDIOGRAM   REFERRING PHYSICIAN:  1. Osvaldo Shipper, MD.  2. Gerrit Friends. Dietrich Pates, MD.   CLINICAL DATA:  An 77 year old woman with coronary disease and abnormal  cardiac markers.   M-mode aorta 4.0, left atrium 3.4, septum 1.2, posterior wall 1.0, LV  diastole 3.7, LV systole 2.9.  1. Technically suboptimal but adequate echocardiographic study.  2. Mild left atrial enlargement; normal right atrial size; catheter      present in the right atrium.  3. Normal right ventricular size and function.  4. Mild dilatation of the proximal ascending aorta.  5. Mild aortic valvular sclerosis; very mild insufficiency.  6. Normal mitral valve; mild annular calcification.  7. Normal tricuspid and pulmonic valve; normal proximal pulmonary      artery.  8. Normal aortic arch.  9. Normal IVC.  10.Normal left ventricular size; borderline hypertrophy; mild      hypokinesis of the distal septum and apex; overall LV systolic      function is normal.      Gerrit Friends. Dietrich Pates, MD, [facility]  Electronically Signed     RMR/MEDQ  D:  05/18/2007  T:  05/19/2007  Job:  [id number]

## 2011-01-14 NOTE — Telephone Encounter (Signed)
Pt says she was given cipro 500 mg's for a UTI.  She says this dose is too strong for her, it's making her nervous, she wants something different called to [facility] in [place].  I suggested she cut these in half.  She was supposed to take 500 mg's twice a day for 3 days, she took one Saturday night and one last night.  Please advise.

## 2011-01-14 NOTE — Group Therapy Note (Signed)
Brooke [name], Brooke [name]                ACCOUNT NO.:  [id number]   MEDICAL RECORD NO.:  [id number]          PATIENT TYPE:  OBV   LOCATION:  A215                          FACILITY:  [facility]   PHYSICIAN:  Osvaldo Shipper, MD     DATE OF BIRTH:  [birth year]   DATE OF PROCEDURE:  05/18/2007  DATE OF DISCHARGE:                                 PROGRESS NOTE   SUBJECTIVE:  The patient is complaining of some pain in the lower back  area, where she fell.  Denies any chest pain, shortness of breath or  palpitations.   OBJECTIVE:  Her vital signs are stable, heart rate in the 60s, blood  pressure looks good, saturation 89-95% on room air.  LUNGS:  Clear to auscultation bilaterally.  No wheezing, rales or  rhonchi.  CARDIOVASCULAR:  S1, S2 is normal, regular.  No murmurs appreciated.  ABDOMEN:  Soft, nontender, nondistended, bowel sounds present.  No mass  or organomegaly is appreciated.  EXTREMITIES:  Without edema.   LABORATORY DATA:  Her hemoglobin is slightly low today at 10.9.  Other  parameters are normal.  BMET is normal.  Troponin is down to 0.32.  Her  CK and CK-MB are also normal today.   ASSESSMENT AND PLAN:  1. Likely non-ST-elevation myocardial infarction.  She is to undergo      an echocardiogram.  Reevaluation by cardiology today.  Most likely      she would not be a candidate for any intervention unless the      echocardiogram is grossly abnormal.  I will defer this issue to the      cardiologist.  The patient is on aspirin, which should be      continued. She is on a beta-blocker.  2. Fall without any loss of consciousness.  All the admitting studies      have been negative for any fractures, any acute process.  She is      undergoing physical therapy.  Her son reported to me that she has      had a few other falls this past month.  She is at high risk for      fall at home.  I have told the patient that placement would be the      best option for her in term of safety.  The patient  is quite      explicitly declining any attempts at placement.  She says she is      going to get help for around-the-clock care at home.  I have told      her that if she goes home and has another fall this could      potentially be very dangerous to her health and could cause a lot      of trauma.  She understands the risks but she still wants to go      home.  3. Urinary tract infection.  Continue Cipro for a total of 7 days.  4. History of hypertension.  Continue her antihypertensive agents.      She is on hydrochlorothiazide for  now.  She is also on a beta      blocker which can be continued.  5. Deep vein thrombosis prophylaxis ongoing.   Plan is once she is cleared by cardiology she will be able to go home.  I think today she is going to try and arrange for some home care aide or  something like that.  She is going to talk with her son as well.  I did  discuss all these  issues with the son yesterday.  I told him that it will be impossible  for [place] to force her to go to a nursing home.  He understood the  predicament.   Plan is potential discharge in the next 1-2 days.      Osvaldo Shipper, MD  Electronically Signed     GK/MEDQ  D:  05/18/2007  T:  05/18/2007  Job:  [id number]

## 2011-01-14 NOTE — H&P (Signed)
Brooke [name], Brooke [name]                ACCOUNT NO.:  [id number]   MEDICAL RECORD NO.:  [id number]          PATIENT TYPE:  OBV   LOCATION:  A215                          FACILITY:  [facility]   PHYSICIAN:  Marcello Moores, MD   DATE OF BIRTH:  [birth year]   DATE OF ADMISSION:  05/16/2007  DATE OF DISCHARGE:  LH                              HISTORY & PHYSICAL   PRIMARY MEDICAL DOCTOR:  Kirk Ruths, M.D.   CHIEF COMPLAINT:  Fall down at home.   HISTORY OF PRESENT ILLNESS:  Ms. Yagi is a 77 year old female patient  with a history of multiple medical problems.  She came with a history of  fall down at home. She stated that clearly when she was trying to move  her things at home, she fell down because of mechanical obstruction, and  she did not have any episode of dizziness, syncope or any palpitation,  but after she fell down she could not get up out of it, and she stayed  there for some hours and she was brought to emergency room.  Otherwise,  she has not any chest pain, no pain in other parts of the body as well,  and she is a very alert and fairly oriented lady and she gives history  in detail.  She lives at home alone, without any other help, and he is  able to do her daily activities by herself.  Otherwise, the patient has  no other symptoms, no chest complaints, no shortness of breath or cough,  no abdominal complaints.  No urinary complaint as well, except that she  has chronic stress incontinence which was there for some for several  years.  The __________  son stated that he did not feel good sending her  back home, even though she is stable and has not any abnormality, and I  am admitting her just for possibility of placement, probably in a  resting home or assisted living home.  Her family agreed, and the Arts development officer will evaluate her for possibility of that.   REVIEW OF SYSTEMS:  A 10-point review of system is as dictated in the  HPI, otherwise noncontributory.   ALLERGIES:  SHE IS ALLERGIC TO BENADRYL, DARVOCET, INSULIN AND SULFA  DRUGS.   PAST MEDICAL HISTORY:  1. History of cardiac arrhythmia, bradycardia, status post pacemaker.  2. History of GERD.  3. History of CVA.  4. Hypertension.   HOME MEDICATIONS:  1. Aspirin 81 mg p.o. daily.  2. Hydrochlorothiazide 25 mg p.o. daily.  3. Multivitamins.  4. Prilosec 30 mg p.o. daily.  5. Potassium chloride 10 mEq p.o. daily.  6. Ativan p.r.n.  7. Metoprolol 25 mg p.o. daily.   PHYSICAL EXAMINATION:  GENERAL:  The patient is lying in the bed, fairly  oriented and alert.  No distress.  VITAL SIGNS:  Temperature 98, pulse was in 50s in the emergency room,  and respiratory rate is 20 and the blood pressure is 135 x 55, and  saturation is 96 on room air, in the emergency room.  HEENT:  She has pink conjunctivae.  Nonicteric sclera.  NECK:  Supple.  CHEST:  She has good air entry bilateral.  No wheezes or cramps.  CVS:  S1-S2, well-heard.  She has pacemaker on the left anterior chest.  ABDOMEN:  Is flat.  No area of tenderness.  No organomegaly.  Normoactive bowel sounds.  EXTREMITIES:  She has not any pedal edema, peripheral pulses positive.  CNS:  She is alert and fairly oriented lady.  No neurological deficit is  appreciated.   LABS:  CBC:  White blood cells is 7.4, hemoglobin is 12 and hematocrit  is 34, platelet count is 339.  Chemistry:  Sodium is 144, potassium is  3.7, chloride is 109, bicarb is 27 and glucose is 121, BUN 11,  creatinine is 0.6.  Urinalysis is showing white blood cells 21-50 per  high field and moderate triple ascites with nitrates negative, the CK is  227 and CK-MB is 9.2.  Troponin is 1.07.   ASSESSMENT:  1. Urinary tract infection.  The patient will be on Cipro p.o.      medication and will send also urine culture.  She is febrile, and      there is no leukocytosis.  2. History of fall.  It is not related to syncope or dizziness.  It      was a mechanical fall.   Will watch her and will call social worker      to evaluate her for possibility of placing her in a resting home or      assisted living home, if the patient agrees.  3. Elevated CK-MB and troponin.  The patient has not any symptom or      any sign of heart-related issues, but probably this is related to      her fall and the lying in the floor for a few hours, especially the      elevated CK and CK-MB, and it not very high and will monitor it,      and if the patient is staying we might involve cardiologist also,      before she is discharged, more for the reason of followup with her      pacemaker and this episode.  The possibility of MI is less likely      at this time, likely __________ .  4. History of arrhythmias, bradycardia status post pacemaker.  The      heart rate on the emergency room was in the 50s, and she was on      metoprolol and I will hold the metoprolol for now, and we can re-      start it when the pulse rate is on the higher side or in the normal      side.  Otherwise, will continue her home medications and the      patient is very stable, and she has not any sign of dehydration.  I      will put her on DVT and GI prophylaxis, and the patient is admitted      for observation and possibility of placement.      Marcello Moores, MD  Electronically Signed     MT/MEDQ  D:  05/17/2007  T:  05/17/2007  Job:  [id number]

## 2011-01-14 NOTE — Assessment & Plan Note (Signed)
[facility]                         ELECTROPHYSIOLOGY OFFICE NOTE   Brooke [name], WHERLEY                       MRN:          [id number]  DATE:05/10/2007                            DOB:          August 25, 1925    Brooke [name] returns today for follow-up.  She is a very pleasant, elderly  woman with a history of symptomatic bradycardia and sinus node  dysfunction, status post pacemaker insertion who returns today for  follow-up.  She underwent implantation of a Kappa 900 back in July of  2002.  She returns today for follow-up.  She notes that she recently  fell in her bathtub but otherwise has been stable.   PHYSICAL EXAMINATION:  She is a pleasant, elderly woman in no distress.  The blood pressure was 120/68, the pulse 64 and regular, respirations  were 18, weight was not recorded secondary to her wheelchair dependence.  LUNGS:  Clear bilaterally to auscultation.  No wheezes, rales, or  rhonchi are present.  CARDIOVASCULAR.  Regular in rate and rhythm, normal S1/S2.  No murmurs,  rubs, or gallops present.  EXTREMITIES:  Demonstrated no cyanosis, clubbing, or edema.  The pulses  were 2+ and symmetric.   Interrogation of the pacemaker demonstrates a Medtronic Kappa 900 with  no P-waves.  Her R-waves were 15.  The impedence was 629 in the atrium,  614 in the ventricle,threshold 0.5 and 0.4 in the atrium, 0.75 and 0.5  in the right ventricle.  __________  was 2.7 per volt.   IMPRESSION:  1. Significant bradycardia.  2. Status post pacemaker.  3. Hypertension.  4. Discussion over on Brooke [name] is stable.  Her pacemaker is working      normally.  We will plan on seeing her back in the office in six      months for follow-up for pacemaker.     Brooke [name]. Brooke Ridgel, MD  Electronically Signed    GWT/MedQ  DD: 05/20/2007  DT: 05/20/2007  Job #: [id number]

## 2011-01-17 NOTE — H&P (Signed)
Brooke [name], Brooke [name]                          ACCOUNT NO.:  [id number]   MEDICAL RECORD NO.:  [id number]                   PATIENT TYPE:  INP   LOCATION:  A220                                 FACILITY:  [facility]   PHYSICIAN:  Kirk Ruths, M.D.            DATE OF BIRTH:  Nov 05, 1924   DATE OF ADMISSION:  08/04/2003  DATE OF DISCHARGE:  08/05/2003                                HISTORY & PHYSICAL   CHIEF COMPLAINT:  Chest discomfort with nausea.   PRESENTING ILLNESS:  This is a 77 year old white female who has a pacemaker  and a history of hypertension.  The patient stated she got upset the night  before crying herself to sleep because her neighbor has started smoking.  The patient states she has had chest tightness with indigestion, some nausea  off and on that day.  The patient states the discomfort is relieved by  Maalox but tends to give her diarrhea, and she is hesitant to take Maalox.   In the emergency room, chest x-ray was normal.   EKG was normal except for right chamber pacer.   Initial cardiac enzymes were negative.   She was admitted for rule out myocardial infarction due to past medical  history.   PAST MEDICAL HISTORY:   ALLERGIES:  She is allergic to:  1. PENICILLIN.  2. SULFA.  3. BENADRYL.   MEDICATIONS:  She currently takes:  Allegra, vitamins, Robitussin,  hydrochlorothiazide 12.5, KCl, Prevacid 30, aspirin 81 mg, Lopressor 12.5  h.s., Ativan p.r.n.   SOCIAL HISTORY:  The patient lives alone.  No smoking, no alcohol use.   PAST MEDICAL HISTORY:  Remainder of past medical history.  The patient does  have a history of:  1. Colon cancer, 1999, but had a normal colonoscopy November 2004.  2. Patient also has a history of previous CVA for which she has no residual     sequela.   PHYSICAL EXAMINATION:  GENERAL:  An elderly white female in no severe  distress.  VITAL SIGNS:  She is afebrile.  O2 sats are 99% on room air.  Blood pressure  is 150/75.   Heart rate is 70 and regular.  Respirations 18.  HEENT:  TMs normal.  Pupils equal, reactive to light and accommodation.  Oropharynx benign.  NECK:  Supple without JVD, bruit, thyromegaly.  LUNGS:  Clear in all areas.  HEART:  Regular sinus rhythm without murmur, gallop or rub.  ABDOMEN:  Soft and nontender.  EXTREMITIES:  Without clubbing, cyanosis or edema.  NEUROLOGIC:  Grossly intact.   ASSESSMENT:  1. Chest pain, probably gastroesophageal reflux disease related, rule out     myocardial infarction.  2. History of colon cancer with a normal colonoscopy, November 2004.  3. History of hypertension.  4. Remote history of cerebrovascular accident.  5. History of pacemaker.     ___________________________________________  Kirk Ruths, M.D.   WMM/MEDQ  D:  08/05/2003  T:  08/05/2003  Job:  [id number]

## 2011-01-17 NOTE — H&P (Signed)
NAMELAPORSCHA, Brooke [name]                          ACCOUNT NO.:  [id number]   MEDICAL RECORD NO.:  [id number]                   PATIENT TYPE:  INP   LOCATION:  A222                                 FACILITY:  [facility]   PHYSICIAN:  Patrica Duel, M.D.                 DATE OF BIRTH:  August 20, 1925   DATE OF ADMISSION:  08/19/2003  DATE OF DISCHARGE:                                HISTORY & PHYSICAL   CHIEF COMPLAINT:  Chest pain.   HISTORY OF PRESENT ILLNESS:  This is a 77 year old female with a history of  heart block necessitating pacemaker placement.  She also had a CVA in 1999;  minimal residual is present.  She underwent a partial colectomy in 1999 for  colon cancer.  Colonoscopy performed November 2004 benign.   Over the past several weeks the patient has experienced symptoms compatible  with esophagitis, most likely yeast.  She was treated with Nystatin and her  symptoms completely resolved.  A barium swallow was benign last week.   The patient presented to the emergency department the day of admission with  the sudden onset of a different pain in her chest.  It was rated 10/10.  She presented to the emergency department where she was admitted with  nitroglycerin and aspirin and her pain responded promptly.  A workup in the  emergency department was benign.  An EKG revealed a paced rhythm and cardiac  enzymes were negative.  A chest x-ray was benign as was a CT of her chest.   The patient underwent cardiac catheterization in 2001.  Her arteries, at  that time revealed absence of left main trunk.  She had a 30% narrowing in  her proximal LAD as well as the midsection of the first diagonal branch.  There was a mild narrowing of 20% of the left circumflex.  There was a 50%  lesion in the midsection of the right coronary.  Left ventricular function  was normal.   Given the nature of her symptoms as well as presence of documented coronary  disease it is felt that further evaluation therapy  is indicated.  There are  no beds at [facility] and she is going to be admitted to our  telemetry unit for following and further care as indicated.   There is no history of any neurologic deficits, nausea, vomiting, diarrhea,  or other GI complaints. She also denies genitourinary symptoms.  She is  somewhat depressed at the anniversary of her husband's death 7 years ago.   CURRENT MEDICATIONS:  1. Prevacid 30 mg daily.  2. KCl 10 mEq daily.  3. Aspirin 81 daily.  4. HCTZ 12.5 daily.  5. Allegra p.r.n.  6. Vitamins.  7. Robitussin.  8. She has recently finished her Nystatin suspension.   ALLERGIES:  Nondocumented.   PAST MEDICAL HISTORY:  As noted above.   SOCIAL HISTORY:  Nonsmoker, nondrinker.  She lives alone.   FAMILY HISTORY:  Noncontributory.   PHYSICAL EXAMINATION:  GENERAL:  A very pleasant elderly female in no acute  distress.  She is occasionally tearful.  VITAL SIGNS:  On presentation, BP 150/112, heart rate is 68, respirations  24. She is afebrile.  HEENT:  Normocephalic, atraumatic.  The pupils are equal.  Ears, nose, and  throat are benign.  NECK:  Neck is supple.  HEART:  Heart sounds are normal.  LUNGS:  Lungs are clear.  ABDOMEN:  Abdomen is nontender, nondistended.  Bowel sounds are intact.  EXTREMITIES:  No clubbing, cyanosis, or edema.   LABORATORY DATA:  CBC is normal.  Cardiac enzymes are negative.  MET-7  normal.  Pulse oximetry:  O2 saturation 99%.   ASSESSMENT:  Chest pain which is somewhat atypical in an elderly female with  documented coronary disease status post catheterization in 2001.  Recent  episode of apparent yeast esophagitis which has been treated successfully.   PLAN:  Admit for rule out myocardial infarction protocol.  Empiric addition  of Imdur.  Continue aspirin therapy.  Increase PPI.  Will ask Dr. Dorethea Clan to  see the patient in consult.  Will follow and treat expectantly.      ___________________________________________                                         Patrica Duel, M.D.   MC/MEDQ  D:  08/20/2003  T:  08/20/2003  Job:  [id number]

## 2011-01-17 NOTE — Cardiovascular Report (Signed)
[place]  Patient:    Brooke [name], Brooke [name]                       MRN: [id number] Proc. Date: 05/20/00 Adm. Date:  [id number] Attending:  Gerald Dexter CC:         Dr. Nobie Putnam, Jacqlyn Krauss. Wall, M.D. [facility]                        Cardiac Catheterization  PROCEDURE: 1. Left heart catheterization. 2. Left ventriculogram. 3. Selective coronary angiography. 4. Perclose right femoral artery.  DIAGNOSES: 1. Mild coronary artery disease on angiogram. 2. Normal left ventricular systolic function.  INDICATIONS:  Brooke [name] is a 77 year old white female who was hospitalized status post fall with subsequent facial trauma.  The patient has had profound bradycardia and a history of falls in the past.  In addition, she has had an abnormal Cardiolite study suggesting inferior ischemia which has been medically treated.  The patient presents now for further cardiac assessment.  DESCRIPTION OF PROCEDURE:  After informed consent was obtained, the patient was brought to the catheterization lab and both groins were sterilely prepped and draped.  1% lidocaine was used to infiltrate the right groin.  6 French sheath placed in the right femoral artery using the modified Seldinger technique.  6 [place] and JR4 catheters are then used to engage the left and right coronary arteries.  Selective angiography performed in various projections using manual injection of contrast.  A 6 French pigtail catheter was then advanced to the left ventricle and a left ventriculogram performed using power injections of contrast.  At the termination of the case, the Perclose suture closure device was deployed to the right femoral artery until adequate hemostasis was achieved.  The patient tolerated the procedure well and was transferred to the floor in stable condition.  FINDINGS: 1. Left main trunk nonexistant.  The LAD and left circumflex have essentially  separated ostia.  2. LAD.  This is a large caliber vessel that provides two diagonal branches.    There is mild narrowing of 30% in the proximal LAD as well as a midsection    after the first diagonal branch.  The remainder of the vessel has    insignificant irregularities.  3. Left circumflex artery.  This is a medium caliber vessel that provides the    trivial first marginal branch, two larger second marginal branches in the    midsection.  There is mild narrowing of 20% in the second and third    marginal branches.  4. Right coronary artery is dominant.  This is a medium caliber vessel that    provides a small posterior descending artery.  There is a narrowing of    50% in the midsection of the RCA.  5. LV.  Normal left systolic and end diastolic dimensions.  Overall left    ventricular function is well preserved.  Ejection fraction of greater than    55%. No mitral reguritation.  LV pressure is 116/0, aortic is 116/40, LV    EDP equals 7.  ASSESSMENT:  Brooke [name] is a 77 year old white female with noncritical coronary artery disease and normal LV function.  At this point, the patients coronary artery disease will be medically managed and she will be assessed for placement of permanent pacemaker. DD:  05/20/00 TD:  05/20/00 Job: [id number]

## 2011-01-17 NOTE — Consult Note (Signed)
Brooke [name], Brooke [name]                          ACCOUNT NO.:  [id number]   MEDICAL RECORD NO.:  [id number]                   PATIENT TYPE:  INP   LOCATION:  A217                                 FACILITY:  [facility]   PHYSICIAN:  Pricilla Riffle, M.D. [facility]             DATE OF BIRTH:  Mar 24, 1925   DATE OF CONSULTATION:  07/08/2002  DATE OF DISCHARGE:                                   CONSULTATION   IDENTIFICATION:  The patient is a 77 year old woman with a history of mild  CAD by catheterization back in 2001.  She was admitted the other morning  early for evaluation of a funny sensation in her chest, a heart pounding,  and also for increased blood pressure.   HISTORY OF PRESENT ILLNESS:  The patient has a history of permanent  pacemaker placement in 2002 [facility]) for bradycardia.   The other night, she was in bed and she awoke feeling her body pounding.  She said it felt like when she had her pacemaker inserted.  She has not had  any similar episodes in the past.  Currently, she is asymptomatic.   Otherwise, the patient has been troubled with a reported inner ear problem  leading to vertigo.  She is being treated with Antivert.  This has gone on  for several weeks.   ALLERGIES:  1. PENICILLIN.  2. SULFA.  3. PROPOXYPHENE.   CURRENT MEDICATIONS:  1. Aspirin 81 mg q.d.  2. Toprol XL 25 q.d.  3. Protonix 40 q.d.  4. Advair 0.5 t.i.d. p.r.n.  5. Maalox and meclozine p.r.n.   PAST MEDICAL HISTORY:  1. CAD, mild by catheterization in September 2001.  2. Echocardiogram in December 2000 showed LVF of 40%-45%.  3. Remote PUD.  4. History of bradycardia, status post pacemaker placement.  5. Hypertension.  Diuretic recently discontinued secondary to dizziness.  6. History of fall with subsequent SAH.  7. History of vertigo, as noted above.   SOCIAL HISTORY:  The patient lives alone in [place].  She does not smoke,  does not drink.   FAMILY HISTORY:  Significant for CAD.  Also,  one brother with a cerebral  hemorrhage.   REVIEW OF SYSTEMS:  Inner ear problems, as noted, with vertigo.  Weight  loss, some hearing loss.  The patient wears glasses.  Otherwise, as noted  above.  I have reviewed systems.   PHYSICAL EXAMINATION:  GENERAL:  The patient is in no acute distress.  VITAL SIGNS:  Orthostatic blood pressure this morning:  Blood pressure  116/52, pulse 61; laying 129/70 and pulse 61; standing 114/56 and pulse 61.  NECK:  JVP is normal.  No bruits.  LUNGS:  Clear.  CARDIAC:  Rhythm and rate normal.  S1, S2.  No S3, S4, or murmurs.  ABDOMEN:  Benign.  No hepatomegaly.  EXTREMITIES:  2+ pulses distally.  No edema.   LABORATORY DATA:  A  12-lead EKG shows dual-chamber pacing.   Labs are significant for a CK-MB that are normal, troponin negative x2.  TSH  3.06.  Potassium is slightly low at 3.4.  BUN and creatinine of 11.8.  Hemoglobin 12.4.   IMPRESSION:  The patient is a 77 year old woman with a spell of feeling her  body pounding.  I am not clear what this is.  I am not convinced it is an  arrhythmia.  On telemetry, she has not had any ectopy or any significant  arrhythmia.  I think it would be reasonable to send her home with an event  monitor to see if there is anything detectable.  I have encouraged her to  take adequate fluid intake.  No evidence of orthostatic hypotension on exam  and would avoid decongestants.                                               Pricilla Riffle, M.D. [facility]    PVR/MEDQ  D:  07/08/2002  T:  07/09/2002  Job:  [id number]

## 2011-01-17 NOTE — Discharge Summary (Signed)
. [facility]  Patient:    Brooke [name], Brooke [name]                         MRN: [id number] Adm. Date:  05/18/00 Disc. Date: 05/21/00 Attending:  Reinaldo Meeker, M.D.                           Discharge Summary  PRIMARY DIAGNOSIS:  Closed head injury with post-traumatic subarachnoid hemorrhage.  HISTORY OF PRESENT ILLNESS:  The patient is a 77 year old lady, who fell, striking the left side of her head.  She went to the [facility] Emergency Room where a CT scan was obtained.  She was transferred to the [facility] for neurosurgical evaluation.  HOSPITAL COURSE:  On admission, the patient was awake, alert, and oriented with good strength.  She was watched in the hospital.  She was having some arrhythmias and a cardiology consultation was obtained.  They did a catheterization which was unremarkable.  She was therefore able to increase her activity without difficulty.  DISPOSITION:  It was felt that she could be discharged home on May 21, 2000, when her cardiac clearance had stabilized.  FOLLOW-UP:  The plan was for her to follow up a cardiologist approximately one week after discharge and to follow up with me at the same time.  CONDITION ON DISCHARGE:  Improved versus admission. DD:  10/01/00 TD:  10/01/00 Job: [id number]

## 2011-01-17 NOTE — H&P (Signed)
Brooke, [name] NO.:  [id number]   MEDICAL RECORD NO.:  [id number]          PATIENT TYPE:  OBV   LOCATION:  A202                          FACILITY:  [facility]   PHYSICIAN:  Kirk Ruths, M.D.DATE OF BIRTH:  30-Sep-1924   DATE OF ADMISSION:  09/28/2006  DATE OF DISCHARGE:  LH                              HISTORY & PHYSICAL   CHIEF COMPLAINT:  Misery in her shoulder and chest.   PRESENTING ILLNESS:  This is a 77 year old female who had what was  reported as a normal Cardiolite stress by Dr. Gwen Pounds, cardiologist in  [place], approximately a month ago.  The patient does have an  indwelling pacemaker.  The day of admission, the patient states she ate  spaghetti and was having excessive gas and belching.  Again, having a  misery in her left humerus and left chest.  The patient denied nausea,  vomiting, diaphoresis, shortness of breath.  The patient presented to  the emergency room where her pain resolved spontaneously.  She was given  Aggrenox and Lovenox.  The patient's cardiac enzymes were negative at  the time of admission.  An EKG was reported normal, although I do not  have that with me at this moment.  The patient was to be transferred to  Dr. Philemon Kingdom service but there was a bed problem at [facility]  and she will be admitted here for observation with anticipated transfer  when a bed opens up in [facility].   PAST MEDICAL HISTORY:  She is allergic to BENADRYL, DARVOCET, PENICILLIN  AND SULFA.   She currently takes:  1. Lopressor.  2. Prevacid.  3. Ativan.  4. Allegra.   1. The patient has history of the above mentioned pacemaker  2. History of GERD.  3. Previous CVA.  4. Unknown cancer.   SOCIAL HISTORY:  Nonsmoker, nondrinker, non-drug abuser.   PHYSICAL EXAMINATION:  GENERAL:  An elderly female in no distress at  time of my exam.  VITAL SIGNS:  Blood pressure 145/70, pulse 75 and regular, respirations  18, O2 sats 98% on 2  L.  HEENT:  Normal.  Pupils equal, round and reactive to light and  accommodation.  Oropharynx benign.  NECK:  Supple without JVD, bruit, thyromegaly.  LUNGS:  Clear in all areas.  HEART:  Regular sinus rhythm without murmurs, gallops or rubs.  ABDOMEN:  Soft, nontender.  EXTREMITIES:  No clubbing, cyanosis or edema.  NEUROLOGIC:  Grossly intact.   ASSESSMENT:  1. Left chest pain, left arm pain.  Rule out MI.  2. History of pacemaker.  3. History of gastroesophageal reflux disease.  4. Remote history of cerebrovascular accident.      Kirk Ruths, M.D.  Electronically Signed     WMM/MEDQ  D:  09/29/2006  T:  09/29/2006  Job:  [id number]

## 2011-01-17 NOTE — Discharge Summary (Signed)
   NAMELARHONDA, DETTLOFF                          ACCOUNT NO.:  [id number]   MEDICAL RECORD NO.:  [id number]                   PATIENT TYPE:  INP   LOCATION:  A217                                 FACILITY:  [facility]   PHYSICIAN:  Corrie Mckusick, M.D.               DATE OF BIRTH:  1925/07/31   DATE OF ADMISSION:  07/07/2002  DATE OF DISCHARGE:  07/09/2002                                 DISCHARGE SUMMARY   For  history of present illness and past medical history, please see  admission H&P.   HOSPITAL COURSE:  This is a 77 year old female with a probable vasovagal  episode.  She was admitted after she had a funny sensation in her chest.  She denied any proper chest pain or shortness of breath other than  associated symptomatology.  She had no syncopal episode.  Blood pressure was  elevated when EMS arrived.  She was brought in to the emergency department.  It was decided to observe her for 24 hours for any cardiac arrhythmias.  Telemetry has remained normal at 48 hours.  She has had no chest pain or  further episodes.  It is felt like this might be most likely positional  vertigo.   Please see cardiology note for details.  On 07/09/02 the patient was ready  for discharge.   PHYSICAL EXAMINATION:  VITAL SIGNS: T-max 98.1, pulse 60-64, respiratory  rate 18, blood pressure 116-129/62/70.  GENERAL: The patient is pleasant, talkative, and in no acute distress.  CHEST: Clear to auscultation bilaterally.  CARDIOVASCULAR: Normal S1 S2.  No extra sounds, gallops, or rubs.  ABDOMEN: Soft, nontender.  EXTREMITIES: No edema.   LABORATORY DATA:  TSH 3.0.  CPK, troponins, MB fractions all normal.  Chem-7  normal except for a potassium of 3.4 and sodium of 133.   DISCHARGE MEDICATIONS:  Same as admission plus the addition of:  1. Potassium chloride 2 mEq q.d.  2. Antivert 25 mg p.o. t.i.d. p.r.n.   FOLLOW UP:  In 1-2 weeks at [facility].  Discharge followup per cardiology as  well.   CONDITION ON  DISCHARGE:  Improved and stable.                                               Corrie Mckusick, M.D.    JCG/MEDQ  D:  07/09/2002  T:  07/09/2002  Job:  [id number]

## 2011-01-17 NOTE — Discharge Summary (Signed)
Brooke [name], Brooke [name]                          ACCOUNT NO.:  [id number]   MEDICAL RECORD NO.:  [id number]                   PATIENT TYPE:  INP   LOCATION:  A222                                 FACILITY:  [facility]   PHYSICIAN:  Patrica Duel, M.D.                 DATE OF BIRTH:  [birth year]   DATE OF ADMISSION:  08/19/2003  DATE OF DISCHARGE:  08/22/2003                                 DISCHARGE SUMMARY   DISCHARGE DIAGNOSES:  1. Chest pain of undetermined etiology.  Adenosine Cardiolite negative for     ischemia.  Rule out myocardial infarction protocol negative.  2. History of complete heart block, status post pacemaker placement.  3. History of cerebrovascular accident in 1999, with minimal residual.  4. Partial colectomy in 1999, for colon cancer (colonoscopy November 2004,     benign).   HISTORY OF PRESENT ILLNESS:  For details regarding admission, please refer  to admitting note.  This 77 year old female, with the above history,  presented to the emergency department on the day of admission with sudden  onset of chest pain.  It was rated at 10/10.  She was given nitroglycerin  and aspirin with some response.  A workup in the emergency department was  benign and EKG revealed a paced rhythm with cardiac enzymes negative.  A  chest x-ray as well as CT of chest were benign.  Of note, the patient had  cardiac catheterization in 2001, where she had mild coronary disease only.   Given the nature of her symptoms, as well as present documented coronary  disease, it was felt that further evaluation and therapy was indicated.  She  is admitted here as there were no beds at [facility].   HOSPITAL COURSE:  The patient's rule out MI protocol was negative.  Her  rhythm remained paced.  Dr. Dorethea Clan saw the patient in consult.  An adenosine  Cardiolite was performed which was negative.  By hospital day #3, she was  stable for discharge with no recurrent symptoms.  Etiology of her chest pain  remains  undetermined.  Of note, the patient had esophagitis (clinically  typical) and a barium swallow was performed the week prior to this  admission.  This was benign as well.  Her symptoms from that had resolved  with appropriate management.   DISCHARGE MEDICATIONS:  1. Prevacid 30 mg q.d.  2. K-Dur 10 mEq q.d.  3. Aspirin 81 mg q.d.  4. HCTZ 12.5 mg q.d.  5. Allegra.  6. Vitamins.  7. Robitussin p.r.n.  8. Nystatin suspension on p.r.n. basis.   FOLLOW UP:  She will be followed and treated expectantly.     ___________________________________________  Patrica Duel, M.D.   MC/MEDQ  D:  09/04/2003  T:  09/04/2003  Job:  [id number]

## 2011-01-17 NOTE — Discharge Summary (Signed)
[name], [name]                ACCOUNT NO.:  [id number]   MEDICAL RECORD NO.:  [id number]          PATIENT TYPE:  INP   LOCATION:  A326                          FACILITY:  [facility]   PHYSICIAN:  Madelin Rear. Sherwood Gambler, MD  DATE OF BIRTH:  October 24, 1924   DATE OF ADMISSION:  07/15/2005  DATE OF DISCHARGE:  11/15/2006LH                                 DISCHARGE SUMMARY   DISCHARGE MEDICATION:  Protonix 40 mg p.o. daily.   DISCHARGE DIAGNOSES:  1.  Chronic cholecystitis.  2.  Hypertension.   SUMMARY:  Patient was admitted with abdominal pain.  Her workup showed  evidence of chronic cholecystitis with a reduced ejection fraction of 9% on  hepatobiliary scan.  It was acalculous.  She was seen in consultation with  surgery, however, she refused to have surgery.  She was subsequently  discharged improved for followup in the office.      Madelin Rear. Sherwood Gambler, MD  Electronically Signed     LJF/MEDQ  D:  07/27/2005  T:  07/27/2005  Job:  [id number]

## 2011-01-17 NOTE — H&P (Signed)
NAMESHARONNA, Brooke [name]                ACCOUNT NO.:  [id number]   MEDICAL RECORD NO.:  [id number]          PATIENT TYPE:  INP   LOCATION:  A326                          FACILITY:  [facility]   PHYSICIAN:  Madelin Rear. Sherwood Gambler, MD  DATE OF BIRTH:  May 29, 1925   DATE OF ADMISSION:  07/15/2005  DATE OF DISCHARGE:  LH                                HISTORY & PHYSICAL   CHIEF COMPLAINT:  Abdominal pain.   HISTORY OF PRESENT ILLNESS:  The patient, shortly after lunch today prior to  emergency room visit, developed acute onset of abdominal pain in the  epigastric and right upper quadrant area followed by vomiting that was  projectile by her significant other's description.  She had no hematemesis,  hematochezia or melena.  She did have some constipation which responded to  some Colace p.o. at home prior to this with a hard firm bowel movement  within 24 hours.   PAST MEDICAL HISTORY:  1.  Colonic CA, status post right hemicolectomy with positive nodes.  Last      surveillance CT in 2004.  2.  History of hypertension.  3.  Status post pacemaker placement implantation.   SOCIAL HISTORY:  Nonsmoker, nondrinker.  No drug abuse.  She does live  alone.   FAMILY HISTORY:  Noncontributory.   REVIEW OF SYSTEMS:  Under HPI, all else negative.   PHYSICAL EXAMINATION:  SKIN:  Unremarkable.  HEENT:  No JVD or adenopathy.  NECK:  Supple.  CHEST:  Clear.  CARDIAC:  Regular rate and rhythm with no murmurs, rubs or gallops.  ABDOMEN:  Positive normoactive bowel sounds.  No organomegaly or masses.  On  the a.m. of admission, the patient had no guarding or rebound tenderness and  complained of minimal cramping pain with nausea.  EXTREMITIES:  Without clubbing, cyanosis or edema.  NEUROLOGIC:  Nonfocal.   LABORATORY DATA AND X-RAY FINDINGS:  Labs obtained in the emergency room  included CBC, electrolytes and liver function tests with urinalysis.  Gallbladder ultrasound was also obtained and reported to me  verbally as  negative by the ER physician.   Blood work was unrevealing.   IMPRESSION:  1.  Abdominal pain, questionable etiology.  She is status post colonic      carcinoma with last surveillance 2 years prior to admission.  2.  Hypertension.  3.  The differential includes mesenteric ischemia, although this is abrupt      onset and had no prodrome, doubtful at present.  Computed tomography      angiography may be performed if the above-mentioned tests are negative.      Possible gastrointestinal consultation if indicated.   PLAN:  1.  Will obtain a CT scan and rule out recurrence as well as obstruction.      She also will need assessment of pancreatic enzymes to rule out      pancreatitis.  A hepatobiliary scan is anticipated as well.  2.  Maintain beta-blocker if she tolerates it orally with p.r.n. analgesics      and p.r.n. antiemetics.      Madelin Rear.  Sherwood Gambler, MD  Electronically Signed     LJF/MEDQ  D:  07/15/2005  T:  07/15/2005  Job:  [id number]

## 2011-01-17 NOTE — Discharge Summary (Signed)
[place]. [facility]  Patient:    Brooke [name]                        MRN: [id number] Adm. Date:  [id number] Disc. Date: 09/12/99 Attending:  Faith Rogue T Dictator:   Bynum Bellows. Idacavage, P.A.C. CC:         Faith Rogue, M.D.             Gerrit Friends. Dietrich Pates, M.D. [facility]             Eric S. Mariel Sleet, M.D.             Patrica Duel, M.D., [place], [place]                           Discharge Summary  DIAGNOSES: 1. Cerebrovascular accident right middle cerebral artery distribution. 2. Sinus bradycardia and junctional rhythms. 3. Colon cancer. 4. Osteoarthritis. 5. Pyuria. 6. Enterococcus urinary tract infection. 7. Yeast infection.  HISTORY OF PRESENT ILLNESS AND HOSPITAL COURSE:  The patient is a 77 year old female who was originally admitted to [facility] on August 18, 1999,  after falling at home.  She developed moderate left hemiplegia and facial droop.  A CT scan was negative for acute change.  Carotid duplex within normal limits. TEE was negative for thrombus. Delayed swallowing was noted on the patients modified barium swallow.  She was transferred to [facility] on August 23, 1999, where she has participated in greater than three hours per day of physical, occupational, and speech therapies.  She has progressed to the point at this time where she is on a dysphagia III with all liquids, and has been tolerating this well, without signs or symptoms of aspiration.  She has made significant progress with her therapies.  Medical complications have included an enterococcus UTI which was treated with Macrodantin and a yeast infection which was treated with Monistat. She has had no other significant complications.  At this time point in time she is noted to be independent with her bed mobility.  She is at a supervisory level with her transfers, and she is able to ambulate 150 feet with close supervision and  rolling  walker.  DISCHARGE MEDICATIONS: 1. Allegra as needed. 2. Lomotil 2.5 mg four times a day as needed. 3. Potassium 20 mEq daily. 4. Nu-Iron twice a day. 5. Megace 200 mg twice a day. 6. Ativan 0.5 mg twice a day as needed. 7. Plavix 75 mg daily. 8. Pepcid 20 mg twice a day.  DISPOSITION:  She will be discharged to assisted-living facility.  FOLLOW-UP:  She will need to follow up with Dr. Nobie Putnam in one month, Dr. Mariel Sleet as per schedule, and Dr. Dietrich Pates should any cardiac arrhythmias arise.  CONDITION ON DISCHARGE:  Stable. DD:  09/11/99 TD:  09/11/99 Job: [id number]

## 2011-01-17 NOTE — Consult Note (Signed)
[place]. [facility]  Patient:    Brooke [name], Brooke [name]                       MRN: [id number] Proc. Date: 05/20/00 Adm. Date:  [id number] Attending:  Gerald Dexter CC:         Dr. Elpidio Eric  Dr. Abbey Chatters. Daleen Squibb, M.D. [facility]  Gerrit Friends. Dietrich Pates, M.D. [facility]   Consultation Report  INDICATION FOR CONSULTATION:  Evaluation of bradycardia.  HISTORY OF PRESENT ILLNESS:  The patient is a 77 year old woman, who was admitted to the hospital after having a fall with resultant trauma to her left face and small subarachnoid hemorrhage.  The patient states that she was in a hurry and tripped on the steps while exiting her car.  She does not know how she could not have braced her fall with her hands but was unable to do so. She had a similar fall back in December 2000 and was diagnosed with a stroke at that time.  She also notes several other falls in the past, and there is a question of one episode of syncope with arm fracture.  The patient denies palpitations but has had some problems with chest discomfort.  PAST MEDICAL HISTORY:  Notable for peptic ulcer disease with a hiatal hernia. She has a history of right middle cerebral artery stroke.  She has a history of negative carotid ultrasound by report back in December.  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  FAMILY HISTORY:  Noncontributory.  SOCIAL HISTORY:  The patient denied any ethanol or tobacco use.  REVIEW OF SYSTEMS:  Notable for some left-sided weakness.  She has some unsteadiness in her gait.  She has some reflux symptoms and occasional constipation.  She denies any other problems except for frequent urinary tract infections and an occasional arthritic complaint.  PHYSICAL EXAMINATION:  GENERAL:  She is a pleasant, frail, elderly woman in no distress.  VITAL SIGNS:  The blood pressure today was 116/50, the pulse was 55 and regular, the respirations  were 16.  NECK:  No jugular venous distention.  CARDIOVASCULAR:  Regular rate and rhythm.  ABDOMEN:  Soft and nontender.  The right groin demonstrated no evidence of hematoma.  EXTREMITIES:  Only trace peripheral edema.  LABORATORY DATA:  EKG demonstrates underlying normal sinus rhythm with interventricular conduction delay and poor R-wave progression from the anterior precordium with nonspecific ST changes.  IMPRESSION: 1. Sinus bradycardia. 2. Recurrent falls of unclear etiology. 3. Positive Cardiolite stress test, status post left heart catheterization    demonstrating preserved left ventricular function and no obstructive    coronary disease. 4. Status post cerebrovascular accident.  DISCUSSION:  At this point, I am not clear that Ms. Talleys bradycardia is symptomatic.  Her heart rate has increased and, in fact, when she was quite bradycardic, it was during the night time after her fall.  She is on no sinus node slowing drugs at the moment.  At present I do not think she needs permanent pacing, as I cannot clearly correlate her "falls" with bradycardia. I would recommend that she stay on the monitor and be allowed to ambulate.  We will plan to discharge her home in the next day or two unless she demonstrates significantly worsening bradycardia.  I would strongly consider a 24-hour Holter as an outpatient so that we might monitor her heart rate once she is discharged home. DD:  05/20/00 TD:  05/21/00 Job: [id number]

## 2011-01-17 NOTE — H&P (Signed)
NAME:  Brooke [name], Brooke [name]                          ACCOUNT NO.:  [id number]   MEDICAL RECORD NO.:  [id number]                   PATIENT TYPE:  OBV   LOCATION:  A217                                 FACILITY:  [facility]   PHYSICIAN:  Burnice Logan, M.D.               DATE OF BIRTH:  Jan 27, 1925   DATE OF ADMISSION:  07/07/2002  DATE OF DISCHARGE:                                HISTORY & PHYSICAL   CHIEF COMPLAINT:  Funny sensation in the chest.   HISTORY OF PRESENT ILLNESS:  A 77 year old white lady who came to the  emergency room after waking up in the middle of the night with a funny  sensation in her chest that was reminiscent of the feeling she had prior to  her pacemaker insertion in July 2002.  She denies having any chest pain or  shortness of breath.  She also denies any diaphoresis and did not pass out.  She called her sister, who asked her to check her pulse.  She took her pulse  at home and after some difficulty finding her pulse she counted 58.  She  repeated this and counted 59 beats within a minute.  Her sister asked her to  call an ambulance and have her checked.  On arrival, EMS found her  asymptomatic but they recorded a blood pressure of 218/110.  She was brought  to the emergency room for further evaluation.  In the emergency room, blood  pressure was 160/82 without any treatment.  The patient did not have any  further complaints of the funny sensation.  She is being admitted to rule  out arrhythmia.   PAST MEDICAL HISTORY:  1. Hypertension.  The patient was recently taken off her fluid pill because     it was thought that this was contributing to her dizziness.  2. Inner ear problem diagnosed about six weeks ago and the patient has been     on Antivert.  3. History of CVA with left hemiparesis that has since resolved.  4. Mild coronary artery disease with normal LV function as per left heart     catheterization in September 2001.  Medical management was advised.  5.  History of hearing impairment.  6. Colon cancer, status post colectomy in 1999 and status post chemotherapy.  7. Pacemaker placement for sick sinus syndrome.  8. Gastroesophageal reflux disease.  9. Chronic constipation.   MEDICATION ALLERGIES:  No known drug allergies.   CURRENT MEDICATIONS:  1. Muco-Fen 120.  2. ___________.  3. Meclizine.  4. Stool softener.   FAMILY HISTORY:  Hypertension and diabetes mellitus.   SOCIAL HISTORY:  The patient lives alone.  Denies smoking or use of alcohol.   REVIEW OF SYSTEMS:  GENERAL:  No fever or chills.  No recent weight loss.  No upper respiratory-type illness.  NEUROLOGIC:  She has no fainting spells  but has had dizziness in spite  of taking her meclozine.  Denies any  headaches or visual impairment.  She has hearing difficulties.  CARDIORESPIRATORY:  No chest pains, no shortness of breath.  No cough or  sputum production.  GASTROINTESTINAL:  She has vague low abdominal pain and  chronic constipation.  Appetite is normal.  GENITOURINARY:  No dysuria or  hematuria.  MUSCULOSKELETAL:  She has no complaints.  PSYCHIATRIC:  She  worries a lot about her kids.  She thinks she is forgetful at times.  All  other systems reviewed and negative.   PHYSICAL EXAMINATION:  GENERAL:  This is an elderly white lady of slender  build lying supine in bed and in no acute distress.  VITAL SIGNS:  Current blood pressure is 160/80.  Heart rate is about 61  beats per minute.  Respiratory rate is 16-18 per minute.  O2 saturations  97%.  HEENT:  Normocephalic and atraumatic.  Equal sized pupils are reactive to  light.  Extraocular movements are full and intact.  There is no facial  asymmetry.  Oropharynx exam is unremarkable.  Right tympanic membrane is  visualized and normal.  Left tympanic membrane is not visualized.  There is  some wax in the left external auditory meatus.  NECK:  Supple with no distend veins.  LUNGS:  Clear to auscultation with no  adventitious sounds.  CARDIOVASCULAR:  Heart sounds 1 and 2 are heard with occasional  extrasystole.  ABDOMEN:  Soft, nontender.  Normal bowel sounds.  No organomegaly.  NEUROLOGIC:  The patient is alert and oriented x3.  Cranial nerves II-XII  are intact except for hearing impairment.  She also has positional vertigo  evidenced by dizziness when she sits upright in the bed.  There are no focal  motor deficits and sensation is largely intact.  EXTREMITIES:  No pedal edema.   LABORATORY DATA:  EKG shows a paced rhythm at 60 beats per minute.  She had  a normal head CT done on June 10, 2002.  White count is 4.7, hemoglobin  11.4, hematocrit 33.5, MCV of 93.  CPK 114, MB 2.6, troponin 0.01.  Sodium  133, potassium 3.4, BUN 11, creatinine 0.8.   ASSESSMENT AND PLAN:  1. Palpitations, rule out cardiac arrhythmia.  The patient will be admitted     on telemetry for observation.  Orthostatic vital signs will be checked.     If her symptoms do not recur and no arrhythmias are observed while on     observation, we will need to consider event monitoring or tilt table     testing.  2. Positional vertigo.  We will continue her Antivert.  She has been asked     to avoid sudden positional changes, as this may worsen her symptoms.  3. Uncontrolled hypertension.  We will start the patient on a low-dose beta-     blocker.  Her heart rate will need to be monitored while on the beta-     blocker for any bradycardia.  4. Anxiety disorder.  We will treat with benzodiazepines.  The patient seems     to worry a lot and this may be contributing to her present feelings of     palpitations and dizziness.  Trial of benzodiazepine to see if there is     any     resolution of her symptoms.  5. Mild normocytic anemia related to her chronic ailments.   Encounter was approximately 60 minutes.  Dr. Nobie Putnam will assume further  care of the  patient.                                              Burnice Logan,  M.D.    ES/MEDQ  D:  07/07/2002  T:  07/07/2002  Job:  [id number]   cc:   Patrica Duel, M.D.  [address], [address]  [place]  [place] [address]  Fax: [phone]

## 2011-01-17 NOTE — Consult Note (Signed)
NAMEKESLEIGH, [name]                          ACCOUNT NO.:  [id number]   MEDICAL RECORD NO.:  [id number]                   PATIENT TYPE:  INP   LOCATION:  A222                                 FACILITY:  [facility]   PHYSICIAN:  Vida Roller, M.D.                DATE OF BIRTH:  1924-12-21   DATE OF CONSULTATION:  08/21/2003  DATE OF DISCHARGE:                                   CONSULTATION   REFERRING PHYSICIAN:  Dr. Patrica Duel.   CARDIOLOGIST:  Dr. Lewayne Bunting.   [name] is a 77 year old white female with a history of nonobstructive  coronary disease who presents with discomfort in her chest which she  describes as 10/10, severe discomfort associated with no shortness of breath  or diaphoresis.  It occurred yesterday afternoon in the setting of an  episode of reflux esophagitis.  This is a woman who has some substantial  problems with esophagitis and Schatzki's ring and antral gastritis on a  recent endoscopy.  She is asymptomatic today, says she feels fine, has no  particular complaints.  No PND nor orthopnea.   PAST MEDICAL HISTORY:  Significant for coronary artery disease status post  catheterization in 2001 where she had nonobstructive disease in her LAD  circumflex and a 50% lesion in her mid right coronary artery with normal  left ventricular function.  She also has symptomatic bradycardia.  She is  status post a pacemaker, Dr. Ladona Ridgel follows her for that.  She had an  evaluation in March of this year and the pacemaker appears to be functioning  fine.  She has a relatively detailed history of peptic ulcer disease with a  hiatal hernia, a history of Schatzki's ring with esophagitis and a history  of odynophagia, something that is ongoing and recurrent with her.  She also  has a history of a CVA a number of years ago with a right middle cerebral  artery stroke.  She had negative carotid ultrasounds back at that time and  there is no clear etiology for this but she has no  significant sequelae.  She has a history of abnormal echocardiogram back in 2000 with an ejection  fraction of 40-45%.  However, a catheterization done subsequent to that  showed an ejection fraction of 55% with no wall motion abnormalities.   FAMILY HISTORY:  Essentially unremarkable for coronary artery disease.   SOCIAL HISTORY:  She does not drink alcohol.  She does not smoke tobacco.  She does not do any illicit drugs.  She lives in [place].   REVIEW OF SYSTEMS:  Essentially noncontributory.   MEDICATIONS CURRENTLY ARE AS FOLLOWS:  1. Aspirin 81 mg enterically coated.  2. Isosorbide dinitrate 30 mg once a day.  3. Protonix 40 mg a day.  4. K-Dur 10 mEq once a day.  5. Tylenol p.r.n.  6. Xanax p.r.n.  7. Guaifenesin p.r.n.  8. Imodium p.r.n.  9. Nitroglycerin p.r.n.  10.      Phenergan p.r.n. q.6h.  11.      Ambien 10 mg p.r.n.  Essentially on really no significant cardiac medications.   ALLERGIES:  1. PENICILLIN, CAUSES HER TO SWELL.  2. SULFA, CAUSES HER TO SWELL.  3. DARVOCET, CAUSES HER TO SWELL.   PHYSICAL EXAMINATION:  She is an elderly, frail white female in no apparent  distress.  She is alert and oriented x4.  She is actually a reasonably good  historian although has some issues with memory.  She is afebrile.  Her pulse  is 59, regular.  Her respirations are 20.  Her blood pressure is 91/54.  HEENT:  Unremarkable.  NECK:  Supple.  There is no jugular venous distention or carotid bruits.  CHEST:  Clear to auscultation bilaterally.  CARDIAC EXAM:  Regular rhythm with no murmurs, rubs or gallops.  She has  nondisplaced point of maximal impulse.  ABDOMEN:  Soft, nontender with normal active bowel sounds.  LOWER EXTREMITIES:  Without significant clubbing, cyanosis or edema.  Her  pulses are 1+ throughout her lower extremities.   ELECTROCARDIOGRAM:  A paced rhythm both atrially and ventricularly.   LABORATORIES:  Her white blood cell count is 5.9, her hemoglobin  is 12.3,  hematocrit is 37, platelets are 327.  This is a normal differential.  Her  pro time is 13.5, INR is 1.0.  Her PTT is 32.  Sodium of 134, potassium of  3.6, chloride 98, bicarbonate of 30, glucose of 101, BUN of 12, creatinine  of 0.8, calcium of 8.8.  She has two sets of cardiac enzymes which are not  consistent with acute myocardial infarction.  She had a chest x-ray which  showed mild cardiomegaly, emphysema but no acute cardiopulmonary processes.  She had a CT scan of her chest with contrast which showed no evidence of  pulmonary embolus and no evidence of DVT.   So, in essence, we have an elderly white female with a history of  esophagitis who presents with atypical chest discomfort which is primarily  related to meals.  She has a history of odynophagia.  She had a relatively  recent heart catheterization which showed nonobstructive disease.  Her  enzymes are negative and her electrocardiogram is paced.  She has  symptomatic bradycardia status post pacemaker without significant symptoms  now.  She has esophagitis and odynophagia with a history of esophageal  pathology.  She has a history of normal left ventricular systolic function.  My recommendation is that we perform an adenosine Cardiolite tomorrow to  assess whether she has significant LV dysfunction and also to assess whether  or not she has significant coronary disease, which I doubt, and then  consider gastroenterology consult for further evaluation.      ___________________________________________                                            Vida Roller, M.D.   JH/MEDQ  D:  08/21/2003  T:  08/21/2003  Job:  [id number]

## 2011-01-17 NOTE — Op Note (Signed)
Brooke [name], [place]                ACCOUNT NO.:  [id number]   MEDICAL RECORD NO.:  [id number]          PATIENT TYPE:  AMB   LOCATION:  DAY                           FACILITY:  [facility]   PHYSICIAN:  Lionel December, M.D.    DATE OF BIRTH:  1924/11/17   DATE OF PROCEDURE:  07/06/2006  DATE OF DISCHARGE:                                 OPERATIVE REPORT   PROCEDURE:  Colonoscopy.   INDICATION:  Chai is a 77 year old Caucasian female with history of colon  carcinoma who underwent right hemicolectomy in August 2000.  She had stage  III disease and has received adjuvant chemotherapy and remains in remission.  She is returning for surveillance colonoscopy.  Her colon is very redundant.  Her last exam was incomplete 3 years ago followed by a barium enema.  She  has intermittent fecal seepage felt to be due to IBS.  Procedure risks were  reviewed with the patient; informed consent was obtained.   MEDICATIONS FOR CONSCIOUS SEDATION:  1. Demerol 50 mg IV.  2. Versed 5 mg IV.   FINDINGS:  Procedure performed in endoscopy suite.  The patient's vital  signs and O2 saturation were monitored during the procedure and remained  stable.  The patient was placed in left lateral decubitus position and  rectal examination performed.  No abnormality noted on external or digital  exam.  Olympus video scope was placed in rectum and advanced under vision  into sigmoid colon.  Preparation was satisfactory.  She had scattered  diverticula at sigmoid colon.  Very redundant splenic flexure.  The patient  had to be turned on her right side in order to pass the scope in transverse  colon which again was quite redundant but using abdominal pressure, I was  able to advance the scope into the hepatic flexure where ileocolonic  anastomosis was identified, was wide open.  Short segment of TI was also  examined and was normal.  Pictures taken for the record.  As the scope was  withdrawn, colonic mucosa was examined for the  second time and there were no  polyps, masses, or changes of colitis.  Rectal mucosa similarly was normal.  Scope was retroflexed to examine anorectal junction.  She had some erythema  at the dentate line felt to be related to prep and passage of scope.  Scope  was straightened, withdrawn.  The patient tolerated the procedure well.   Sigmoid colon diverticulosis.   Wide open ileocolonic anastomosis located in the area of hepatic flexure.   No evidence of proctitis or colitis.   RECOMMENDATIONS:  She will resume her usual diet, fiber supplement, and  Levsin before breakfast and before lunch.   She may consider next colonoscopy in 3 years from now.      Lionel December, M.D.  Electronically Signed     NR/MEDQ  D:  07/06/2006  T:  07/06/2006  Job:  [id number]   cc:   Patrica Duel, M.D.  Fax: [phone]   Ladona Horns. Mariel Sleet, MD  Fax: [phone]
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
## 2011-01-17 NOTE — Procedures (Signed)
NAMEALENCIA, GORDON                          ACCOUNT NO.:  [id number]   MEDICAL RECORD NO.:  [id number]                   PATIENT TYPE:  INP   LOCATION:  A222                                 FACILITY:  [facility]   PHYSICIAN:  Willa Rough, M.D.                  DATE OF BIRTH:  [birth year]   DATE OF PROCEDURE:  08/22/2003  DATE OF DISCHARGE:  08/22/2003                                    STRESS TEST   INDICATION:  Ms. Boeder is a 77 year old female with history of  nonobstructive coronary artery disease by cardiac catheterization in 2001.  This revealed a 30% proximal LAD stenosis, 20% stenosis in the second and  third marginal branches of the circumflex, 50% mid stenosis of the RCA and  an ejection fraction of 55%.  She also has history of symptomatic  bradycardia status post pacemaker placement, she now presents with atypical  chest pain, she has had two sets of cardiac enzymes which are negative for  acute myocardial infarction.   BASELINE DATA:  EKG reveals paced rhythm at 60 beats per minute with a blood  pressure of 158/68.   STRESS TEST:  Twenty seven milligrams of Adenosine was infused over 4-minute  protocol with Cardiolite injected at 3 minutes.  Patient reported chest pain  which resolved quickly in recovery.  EKG revealed no ischemia and paced  rhythm throughout.  Test was stopped secondary to protocol being complete.   FINAL IMAGES AND RESULTS:  Pending MD review.     ________________________________________  ___________________________________________  Jae Dire, P.A. [facility]                      Willa Rough, M.D.   AB/MEDQ  D:  08/22/2003  T:  08/23/2003  Job:  [id number]

## 2011-01-17 NOTE — H&P (Signed)
Brooke [name], Brooke [name]                ACCOUNT NO.:  [id number]   MEDICAL RECORD NO.:  [id number]          PATIENT TYPE:  AMB   LOCATION:  DAY                           FACILITY:  [facility]   PHYSICIAN:  Lionel December, M.D.    DATE OF BIRTH:  Jan 01, 1925   DATE OF ADMISSION:  DATE OF DISCHARGE:  LH                                HISTORY & PHYSICAL   PRESENTING COMPLAINT:  1. Follow for fecal seepage and abdominal swelling.  2. History of colon carcinoma.   HISTORY OF PRESENT ILLNESS:  Brooke [name] is a 77 year old Caucasian female patient  of Dr. Geanie Logan, who was last seen on April 22, 2006, for above  symptomatology.  She had abdominopelvic CT at [facility] on April 17, 2006, without and with oral IV contrast.  There was no evidence of  inflammatory process, adenopathy, fluid or mass.  There was a 9-10 mm cyst  involving the right lobe of the liver laterally.  No gallstones were  identified.  She also had a normal CBC and comprehensive chemistry panel.  Her abdomen was protuberant but no tenderness or abnormality was noted.  Her  rectal examination reveals normal sphincter tone and stool was guaiac-  negative.  I felt that she had IBS and she was asked take Fiber Choice and  begun on dicyclomine 10 mg every morning.  The patient called back 10 days  later stating that dicyclomine was causing blurred vision and dry mouth and  we asked her to discontinue dicyclomine.  Apparently she did not understand  and stayed on it.  She states she developed a rash across the lower abdomen.  She was seen by Dr. Nobie Putnam and dicyclomine was discontinued.  She noted  resolution of her rash.  Now she is back to having fecal seepage.  She  states while she was on dicyclomine she had no problems whatsoever.  She is  concerned about her weight gain.  She remains very concerned about her  abdominal swelling.  She is convinced that her cancer is back.  She denies  nausea or vomiting.  She complains of  chronic pain in her feet and she is  unable to walk or exercise.  She has seen Dr. Ulice Brilliant.  He felt she would  benefit from surgery but elected not to do it because of poor circulation in  her lower extremities.  She has gained about 8 pounds in the last 1 year.  She denies nausea, vomiting, melena or rectal bleeding.  Heartburn is well  controlled with therapy.   MEDICATIONS:  1. She is on complex senior multivitamin daily.  2. Calcium with D 600 mg daily.  3. Anucort HC suppository p.r.n.  4. Metoprolol 25 mg b.i.d.  5. Allegra 180 mg daily.  6. Prevacid 30 mg daily.  7. Artificial Tears bedtime.  8. ASA 81 mg daily.  9. Antivert 12.5 mg t.i.d. p.r.n.  10.Doxycycline 100 mg b.i.d. and she has few more doses left.  11.Ativan 0.5 mg to 1 mg t.i.d. p.r.n.   PAST MEDICAL HISTORY:  1. History of colon carcinoma.  She is status post right hemicolectomy in      August 2000.  She had stage III disease.  She had 3 of 5 lymph nodes      positive.  She did receive adjuvant chemotherapy with an interruption      because of a CVA.  According to Dr. __________ 's note, she had total      of 7 weeks of therapy.  Her last surveillance colonoscopy was in      November 2004 which was incomplete, followed by a barium enema.  2. History of CVA without any residual deficit.  3. She has chronic GERD.  4. She has a permanent pacemaker.  5. She has been diagnosed with IBS in the past.  6. She has had __________ in 2000 with recovery.  7. She had her esophagus dilated in March 2004.  8. She has a permanent pacemaker.  9. She had bilateral cataract extraction.  10.History of anxiety.  11.Chronic foot pain.   FAMILY HISTORY:  Negative for colorectal carcinoma.   SOCIAL HISTORY:  She is widow.  She lives alone.  She has son who lives in  [place]  Another son also lives out of town.  She does not  drink alcohol or smoke.   OBJECTIVE:  VITAL SIGNS:  Weight 130 pounds.  She is 5 feet 4  inches tall.  Pulse 68 per minute, blood pressure 128/68, temp is 98.  HEENT:  She has some hearing impairment.  Conjunctivae are pink.  Sclerae  were nonicteric.  Oral pharyngeal mucosa is normal.  She has dentures in  place.  NECK:  No neck masses noted.  CARDIAC EXAM:  Regular rhythm.  Normal S1.  No murmur or gallop noted.  LUNGS:  Clear to auscultation.  ABDOMEN:  Her abdomen is protuberant.  She has some peeling of epidermis  which is very superficial at hypogastric area.  Bowel sounds are normal and  palpation is soft abdomen without tenderness, organomegaly or masses.  RECTAL EXAMINATION:  Deferred as she had one on her last visit.  EXTREMITIES:  She does not have peripheral edema or clubbing.   ASSESSMENT:  1. Brooke [name] remains with abdominal distention and fecal seepage.  She      responded to low-dose dicyclomine therapy but she developed side      effects including a rash and therapy was discontinued.  The patient      remains very concerned about these symptoms given history of colon      carcinoma.  She did have abdominopelvic CT in August 2007 without      significant findings.  I am afraid her abdominal distention is a result      of the weight gain which is concentrated in her mid abdomen since she      is not able to walk and exercise.  We will try another antispasmodic.  2. History of colon carcinoma.  She is due for surveillance exam.  On her      last exam, she could not be well sedated.   RECOMMENDATIONS:  Levsin/SL 1 tablet before breakfast and second before  lunch.   Surveillance colonoscopy to be performed in the future.  The patient is  advised to take her beta blocker and Ativan the morning of the procedure.  She will hold off her ASA for 2 days.   Further recommendations will be made depending on findings of colonoscopy.      Lionel December, M.D.  Electronically  Signed    NR/MEDQ  D:  05/28/2006  T:  05/29/2006  Job:  [id number]

## 2011-01-17 NOTE — Op Note (Signed)
Brooke [name], Brooke [name]                          ACCOUNT NO.:  [id number]   MEDICAL RECORD NO.:  [id number]                   PATIENT TYPE:  AMB   LOCATION:  DAY                                  FACILITY:  [facility]   PHYSICIAN:  Lionel December, M.D.                 DATE OF BIRTH:  11-Nov-1924   DATE OF PROCEDURE:  10/31/2002  DATE OF DISCHARGE:                                 OPERATIVE REPORT   PROCEDURE:  Esophagogastroduodenoscopy with esophageal dilatation.   INDICATIONS:  The patient is a 77 year old Caucasian female with poorly-  controlled symptoms of GERD, who is also having dysphagia and epigastric  pain.  She is undergoing a diagnostic and therapeutic procedure.  The  procedure was reviewed with the patient and informed consent was obtained.   PREMEDICATION:  Cetacaine spray for pharyngeal topical anesthesia, Demerol  25 mg IV, Versed 3 mg IV in divided dose.   INSTRUMENT USED:  Olympus video system.   FINDINGS:  Procedure performed in endoscopy suite.  The patient's vital  signs and O2 saturations were monitored during the procedure and remained  stable.  The patient was placed in the left lateral recumbent position.  The  endoscope was passed via the oropharynx without any difficulty into  esophagus.   Esophagus:  Mucosa of the esophagus normal.  She had a Schatzki's ring at GE  junction, which was about 34 cm from the incisors.  Below she had at a 5 cm  long hernia, which was estimated to be moderate-sized.   Stomach:  It was empty and distended very well with insufflation.  Folds of  the proximal stomach were normal.  Examination of the mucosa revealed a long  linear single erosion at antrum.  Pyloric channel was patent.  Rest of the  mucosa was normal.  Fundus and cardia were examined by retroflexing the  scope, and the hernia was easily seen on this view.  Angularis was also  examined and was normal.   Duodenum:  Examination of the bulb and second part of the duodenum  was  normal.  Endoscope was withdrawn.   Esophageal dilatation was performed by passing a 54 French Maloney dilator  through the esophagus completely.  As the dilatation was complete, the  endoscope was passed again and a mucosal tear was noted at the cervical  esophagus as well as at GE junction.  The endoscope was straightened and  withdrawn.  The patient tolerated the procedure well.   FINAL DIAGNOSES:  1. Moderate-sized sliding hiatal hernia with Schatzki's ring.  Esophagus     dilated by passing 54 French Maloney dilator, disrupting the ring but     also causing mucosal disruption at cervical esophagus, suggesting a web     or a tubular narrowing.  2. Erosive antral gastritis.    RECOMMENDATIONS:  1. She will continue antireflux measures and Prevacid at 30 mg p.o. q.a.m.  2. She will resume her aspirin as before.  3. H. pylori serology will be checked today.                                               Lionel December, M.D.    NR/MEDQ  D:  10/31/2002  T:  10/31/2002  Job:  [id number]   cc:   Patrica Duel, M.D.  [address], [address]  [place]  [place] [address]  Fax: [phone]

## 2011-01-17 NOTE — Op Note (Signed)
Brooke [name], Brooke [name]                          ACCOUNT NO.:  [id number]   MEDICAL RECORD NO.:  [id number]                   PATIENT TYPE:  AMB   LOCATION:  DAY                                  FACILITY:  [facility]   PHYSICIAN:  Lionel December, M.D.                 DATE OF BIRTH:  07/24/1925   DATE OF PROCEDURE:  07/10/2003  DATE OF DISCHARGE:                                 OPERATIVE REPORT   PROCEDURE:  Attempted colonoscopy which was incomplete.   INDICATIONS FOR PROCEDURE:  Brooke [name] is a 77 year old Caucasian female who  had a right hemicolectomy for [facility] C2 colon carcinoma in August of 2000  (cecal carcinoma).  Her last surveillance exam was in November of 2001.  She  is now returning for surveillance exam.  She recently was treated for  diarrhea, but her bowels are back to normal.  The procedure and risks were  reviewed with the patient, and informed consent was obtained.   PREOPERATIVE MEDICATIONS:  Fentanyl 50 mcg IV, Versed 6 mg IV in divided  doses.   FINDINGS:  The procedure was performed in the endoscopy suite.  The  patient's vital signs and O2 saturations were monitored during the procedure  and remained stable.  The patient was placed in the left lateral recumbent  position.  The pediatric Olympus videoscope was placed into the rectum and  advanced into the region of the sigmoid colon.  A very tortuous noncompliant  sigmoid colon.  Scattered small diverticula were noted at the sigmoid colon.  The scope was passed to the junction of the sigmoid and descending colon.  It was a very sharp turn, and I could never see the lumen.  I tried to  advance it slowly blindly, but the patient was uncomfortable.  I, therefore,  did not attempt to pass the scope any further.  I did change her position to  a supine and also on her right side without any additional benefit.  Therefore, the endoscope was withdrawn.  The rectal mucosa was also examined  on the way out and was normal.  The  scope was retroflexed, and small  hemorrhoids were noted below the dentate line.  The endoscope was  straightened and withdrawn.  The patient tolerated the procedure well.   FINAL DIAGNOSES:  1. Colonoscopy incomplete and limited to flexible sigmoidoscopy.  2.     A few scattered diverticula at sigmoid colon.  3. Small external hemorrhoids.   PLAN:  Will proceed with barium enema hopefully today to review the rest of  her colon.      ___________________________________________                                            Lionel December, M.D.   NR/MEDQ  D:  07/10/2003  T:  07/10/2003  Job:  [id number]   cc:   Barbaraann Barthel, M.D.  [address]  [place]  [place] [address]  Fax: [phone]   Patrica Duel, M.D.  [address], [address]  [place]  [place] [address]  Fax: [phone]

## 2011-03-10 ENCOUNTER — Ambulatory Visit (INDEPENDENT_AMBULATORY_CARE_PROVIDER_SITE_OTHER): Payer: Medicare Other | Admitting: Family Medicine

## 2011-03-10 ENCOUNTER — Encounter: Payer: Self-pay | Admitting: Family Medicine

## 2011-03-10 DIAGNOSIS — R42 Dizziness and giddiness: Secondary | ICD-10-CM

## 2011-03-10 DIAGNOSIS — I10 Essential (primary) hypertension: Secondary | ICD-10-CM

## 2011-03-10 MED ORDER — MECLIZINE HCL 25 MG PO TABS: 12.5000 mg | ORAL_TABLET | Freq: Two times a day (BID) | ORAL | Status: DC | PRN

## 2011-03-10 NOTE — Assessment & Plan Note (Signed)
Acute upon chronic dizziness that seems vertigo like - with spinning feeling and positional component  Ongoing and partially helped with meclizine  This episode is 3 weeks - worried about falls Will ref to ENT for further eval and tx Refilled antivert with warnings of sedation  Hx of cva but no acute neurol changes today

## 2011-03-10 NOTE — Assessment & Plan Note (Signed)
Good control without change 

## 2011-03-10 NOTE — Patient Instructions (Signed)
Please see Shirlee Limerick at check out to get ENT (ear doctor) appt for vertigo/ dizziness  Continue the meclizine/ antivert as needed for dizziness- and use caution as this can make you sleepy  Update me if you do not improve  I sent the antivert/ meclizine px to your pharmacy

## 2011-03-10 NOTE — Progress Notes (Signed)
Subjective:    Patient ID: Brooke [name], female    DOB: 1925/01/03, 77 y.o.   MRN: [id number]  HPI Here for dizziness  Feels "drunk" all the time for 3 weeks-- everything is spinning  Cannot lie flat / raises her head and gets very dizzy Poor hearing aid -- and "ear trouble" all her life Has had this before -- has been on meclizine in the past -- took 1/2 this am and does improve  Worse at night and gets sleepy too  occ gets a little nauseated too  Has had this on and off for years   Saw Dr Melvyn Neth -- [facility] in distant past- her"ear doctor" and he gave her a shot once   Is having a lot of allergy symptoms  Post nasal drip  Sometimes her whole head hurts - like little needles     bp well controlled 122/70 today  Hx of pacer for bradycardia with nl pulse today of 76  Wt is up 3 lb  Remote hx of cva with R mca infarcts - on low dose asa  No weakness or numbness or speech problems  Patient Active Problem List  Diagnoses  . HYPERTENSION, UNSPECIFIED  . BRADYCARDIA  . PACEMAKER, PERMANENT  . POSTHERPETIC NEURALGIA  . FUNGAL DERMATITIS  . HYPOTHYROIDISM  . DEPRESSION  . CORONARY ARTERY DISEASE  . ALLERGIC RHINITIS  . GERD  . FOOT PAIN, CHRONIC  . OSTEOPOROSIS  . CEREBROVASCULAR ACCIDENT, HX OF  . Hyperlipidemia  . Dizzy   Past Medical History  Diagnosis Date  . Other specified cardiac dysrhythmias     bradycardia  . Hypertension   . Cardiac pacemaker in situ   . Thyroid disease   . Allergy   . Depression   . GERD (gastroesophageal reflux disease)   . Cerebrovascular accident   . Osteoporosis   . Mastoiditis, chronic   . Eczema   . Hearing loss   . Chronic foot pain   . Melanoma    Past Surgical History  Procedure Date  . Colon surgery    History  Substance Use Topics  . Smoking status: Never Smoker   . Smokeless tobacco: Not on file  . Alcohol Use: No   Family History  Problem Relation Age of Onset  . Alcohol abuse Father   . Heart disease  Father   . Alcohol abuse Brother    Allergies  Allergen Reactions  . Penicillins     REACTION: arm swelled  . Pneumococcal Vaccine Polyvalent     REACTION: swelling  . Propoxyphene N-Acetaminophen     REACTION: confused pt  . Sulfonamide Derivatives     REACTION: nervousness   Current Outpatient Prescriptions on File Prior to Visit  Medication Sig Dispense Refill  . aspirin 81 MG tablet Take 81 mg by mouth daily.        . clotrimazole (LOTRIMIN) 1 % cream Apply topically as needed.        . hydrochlorothiazide 25 MG tablet Take 25 mg by mouth daily as needed.       Marland Kitchen levothyroxine (LEVOTHROID) 75 MCG tablet 1/2 by mouth daily       . loratadine (CLARITIN) 10 MG tablet Take 10 mg by mouth daily.        . metoprolol (TOPROL-XL) 25 MG 24 hr tablet 1/2 by mouth daily at dinner       . Multiple Vitamin (MULTIVITAMIN) capsule Take 1 capsule by mouth daily.        Marland Kitchen  nystatin (MYCOSTATIN) cream Apply to affected area under breast for rash two times a day as needed       . DISCONTD: meclizine (ANTIVERT) 25 MG tablet Take 12.5 mg by mouth 2 (two) times daily as needed.       . furosemide (LASIX) 20 MG tablet Take 20 mg by mouth daily.        . potassium chloride (KLOR-CON) 10 MEQ CR tablet Take one tablet by mouth once daily when taking Furosemide       . ranitidine (ZANTAC) 150 MG tablet Take 1 tablet (150 mg total) by mouth 2 (two) times daily.  60 tablet  11       Review of Systems Review of Systems  Constitutional: Negative for fever, appetite change, fatigue and unexpected weight change.  Eyes: Negative for pain and visual disturbance.  Respiratory: Negative for cough and shortness of breath.   Cardiovascular: Negative.  for cp or sob or edema  Gastrointestinal: Negative for nausea, diarrhea and constipation.  Genitourinary: Negative for urgency and frequency.  Skin: Negative for pallor.  Neurological: Negative for weakness,  numbness and headaches. neg for speech problems and no  falls Hematological: Negative for adenopathy. Does not bruise/bleed easily.  Psychiatric/Behavioral: Negative for dysphoric mood. The patient is mildly anxious          Objective:   Physical Exam  Constitutional: She is oriented to person, place, and time. She appears well-developed and well-nourished. No distress.  HENT:  Head: Normocephalic and atraumatic.  Right Ear: External ear normal.  Left Ear: External ear normal.  Nose: Nose normal.  Mouth/Throat: Oropharynx is clear and moist.       Poor hearing  Hearing aid in R ear   Eyes: Conjunctivae and EOM are normal. Pupils are equal, round, and reactive to light.       2-3 beats or horiz nystagmus bilat   Neck: Normal range of motion. Neck supple. No JVD present. Carotid bruit is not present. Erythema present. No thyromegaly present.  Cardiovascular: Normal rate, regular rhythm, normal heart sounds and intact distal pulses.   Pulmonary/Chest: Effort normal and breath sounds normal. No respiratory distress. She has no wheezes.  Abdominal: Soft. Bowel sounds are normal. She exhibits no distension and no mass. There is no tenderness.  Musculoskeletal: She exhibits no edema.  Lymphadenopathy:    She has no cervical adenopathy.  Neurological: She is alert and oriented to person, place, and time. She has normal strength and normal reflexes. She displays no tremor and normal reflexes. No cranial nerve deficit or sensory deficit. She exhibits normal muscle tone. She displays a negative Romberg sign. Coordination normal.  Skin: Skin is warm and dry. No rash noted. No erythema. No pallor.  Psychiatric: She has a normal mood and affect.       Mildly anxious Talkative and pleasant           Assessment & Plan:

## 2011-03-19 ENCOUNTER — Telehealth: Payer: Self-pay

## 2011-03-19 MED ORDER — LEVOTHYROXINE SODIUM 75 MCG PO TABS: ORAL_TABLET | ORAL | Status: DC

## 2011-03-19 NOTE — Telephone Encounter (Signed)
[facility] faxed refill request for Synthroid 75 mcg taking one tablet daily. Our med list has pt taking Synthroid 75 mcg taking 1/2 tablet by mouth daily. I called pt contact # and son will call us back with how pt is taking med.

## 2011-03-19 NOTE — Telephone Encounter (Signed)
Pt's son called back and said that pt is taking one half tablet a day.  She needs refill today, as she has run out, [facility]ills [facility] to [facility].

## 2011-03-26 ENCOUNTER — Ambulatory Visit (INDEPENDENT_AMBULATORY_CARE_PROVIDER_SITE_OTHER): Payer: Medicare Other | Admitting: Family Medicine

## 2011-03-26 ENCOUNTER — Ambulatory Visit: Payer: Self-pay | Admitting: Family Medicine

## 2011-03-26 ENCOUNTER — Encounter: Payer: Self-pay | Admitting: Family Medicine

## 2011-03-26 ENCOUNTER — Telehealth: Payer: Self-pay | Admitting: *Deleted

## 2011-03-26 DIAGNOSIS — R600 Localized edema: Secondary | ICD-10-CM | POA: Insufficient documentation

## 2011-03-26 DIAGNOSIS — M79605 Pain in left leg: Secondary | ICD-10-CM | POA: Insufficient documentation

## 2011-03-26 DIAGNOSIS — M79609 Pain in unspecified limb: Secondary | ICD-10-CM

## 2011-03-26 DIAGNOSIS — R609 Edema, unspecified: Secondary | ICD-10-CM

## 2011-03-26 NOTE — Progress Notes (Signed)
Subjective:    Patient ID: Brooke [name], female    DOB: 10-24-1924, 77 y.o.   MRN: [id number]  HPI Here for ankle swelling  Sunday am woke up with pain in L ankle and foot  Put on her short support stocking  Some redness on external ankle  Worse with lying down  Pain eased up last night  Is swollen all the way up her leg   Never had gout Never had a blood clot -- was worried about that    Other leg and foot are ok --just a corn on her foot    On beta blocker No fluid pills or K Has hypothyroid  Lab Results  Component Value Date   TSH 1.97 10/08/2010    Patient Active Problem List  Diagnoses  . HYPERTENSION, UNSPECIFIED  . BRADYCARDIA  . PACEMAKER, PERMANENT  . POSTHERPETIC NEURALGIA  . FUNGAL DERMATITIS  . HYPOTHYROIDISM  . DEPRESSION  . CORONARY ARTERY DISEASE  . ALLERGIC RHINITIS  . GERD  . FOOT PAIN, CHRONIC  . OSTEOPOROSIS  . CEREBROVASCULAR ACCIDENT, HX OF  . Hyperlipidemia  . Dizzy  . Left leg pain  . Edema leg   Past Medical History  Diagnosis Date  . Other specified cardiac dysrhythmias     bradycardia  . Hypertension   . Cardiac pacemaker in situ   . Thyroid disease   . Allergy   . Depression   . GERD (gastroesophageal reflux disease)   . Cerebrovascular accident   . Osteoporosis   . Mastoiditis, chronic   . Eczema   . Hearing loss   . Chronic foot pain   . Melanoma    Past Surgical History  Procedure Date  . Colon surgery    History  Substance Use Topics  . Smoking status: Never Smoker   . Smokeless tobacco: Not on file  . Alcohol Use: No   Family History  Problem Relation Age of Onset  . Alcohol abuse Father   . Heart disease Father   . Alcohol abuse Brother    Allergies  Allergen Reactions  . Penicillins     REACTION: arm swelled  . Pneumococcal Vaccine Polyvalent     REACTION: swelling  . Propoxyphene N-Acetaminophen     REACTION: confused pt  . Sulfonamide Derivatives     REACTION: nervousness  . Zantac  (Ranitidine Hcl) Diarrhea   Current Outpatient Prescriptions on File Prior to Visit  Medication Sig Dispense Refill  . aspirin 81 MG tablet Take 81 mg by mouth daily.        . calcium carbonate (TUMS) 500 MG chewable tablet Chew 1 tablet by mouth daily.        . clotrimazole (LOTRIMIN) 1 % cream Apply topically as needed.        Marland Kitchen levothyroxine (LEVOTHROID) 75 MCG tablet 1/2 by mouth daily  30 tablet  6  . loratadine (CLARITIN) 10 MG tablet Take 10 mg by mouth daily.        . meclizine (ANTIVERT) 25 MG tablet Take 0.5 tablets (12.5 mg total) by mouth 2 (two) times daily as needed for dizziness.  30 tablet  1  . metoprolol (TOPROL-XL) 25 MG 24 hr tablet 1/2 by mouth daily at dinner       . Multiple Vitamin (MULTIVITAMIN) capsule Take 1 capsule by mouth daily.        Marland Kitchen nystatin (MYCOSTATIN) cream Apply to affected area under breast for rash two times a day as needed       .  furosemide (LASIX) 20 MG tablet Take 20 mg by mouth daily.        . hydrochlorothiazide 25 MG tablet Take 25 mg by mouth daily as needed.       . potassium chloride (KLOR-CON) 10 MEQ CR tablet Take one tablet by mouth once daily when taking Furosemide       . ranitidine (ZANTAC) 150 MG tablet Take 1 tablet (150 mg total) by mouth 2 (two) times daily.  60 tablet  11     Review of Systems Review of Systems  Constitutional: Negative for fever, appetite change, fatigue and unexpected weight change.  Eyes: Negative for pain and visual disturbance.  Respiratory: Negative for cough and shortness of breath.   Cardiovascular: Negative. For cp or sob or palpitations   Gastrointestinal: Negative for nausea, diarrhea and constipation.  Genitourinary: Negative for urgency and frequency.  Skin: Negative for pallor. or rash  Neurological: Negative for weakness, light-headedness, numbness and headaches.  Hematological: Negative for adenopathy. Does not bruise/bleed easily.  Psychiatric/Behavioral: Negative for dysphoric mood. The  patient is not nervous/anxious.          Objective:   Physical Exam  Constitutional: She appears well-developed and well-nourished. No distress.       Frail appearing elderly in no distress  HENT:  Head: Normocephalic and atraumatic.  Mouth/Throat: Oropharynx is clear and moist.  Eyes: Conjunctivae and EOM are normal. Pupils are equal, round, and reactive to light.  Neck: Normal range of motion. Neck supple. No JVD present. Carotid bruit is not present. No thyromegaly present.  Cardiovascular: Normal rate, regular rhythm, normal heart sounds and intact distal pulses.   Pulmonary/Chest: Breath sounds normal. No respiratory distress. She has no wheezes.  Abdominal: Soft. Bowel sounds are normal. There is no tenderness.  Musculoskeletal:       L leg- some swelling of ankle - trace to 1plus with little pitting Tender lateral malleolus - mild pain with rom in all directions No skin changes Varicosities are compressible  Tender calf - without t palp cords or mass or warmth Neg homan's sign   Lymphadenopathy:    She has no cervical adenopathy.  Neurological: She is alert. She has normal strength and normal reflexes. No sensory deficit. Coordination normal.  Skin: Skin is warm and dry. No rash noted. No erythema. No pallor.  Psychiatric: She has a normal mood and affect.          Assessment & Plan:

## 2011-03-26 NOTE — Telephone Encounter (Signed)
Please let pt know her leg test is negative for clot - good news Ask her to elevate her leg and use gentle warm compress for several days Update on Friday with how it feels

## 2011-03-26 NOTE — Assessment & Plan Note (Signed)
With swelling and varicosities Differential includes DVT or phlebitis Will send for venous doppler of L leg and update In meantime elevate/ heat prn and alert if worse (see inst)  Will likely need support hose to thigh

## 2011-03-26 NOTE — Telephone Encounter (Signed)
Patient's son Shaunda Tipping notified as instructed by telephone.

## 2011-03-26 NOTE — Patient Instructions (Addendum)
We will refer you for an ultrasound of your leg at check out  This is to rule out a blood clot Varicose veins may cause your pain Elevate leg and use warm compress when you can If symptoms worsen or if any chest pain or shortness of breath - call or go to the ER

## 2011-03-26 NOTE — Telephone Encounter (Signed)
Ultrasound was neg for DVT

## 2011-04-02 ENCOUNTER — Encounter: Payer: Self-pay | Admitting: Family Medicine

## 2011-04-03 ENCOUNTER — Ambulatory Visit (INDEPENDENT_AMBULATORY_CARE_PROVIDER_SITE_OTHER): Payer: Medicare Other | Admitting: Family Medicine

## 2011-04-03 ENCOUNTER — Encounter: Payer: Self-pay | Admitting: Family Medicine

## 2011-04-03 VITALS — BP 110/74 | HR 72 | Temp 97.7°F | Wt 115.8 lb

## 2011-04-03 DIAGNOSIS — M79609 Pain in unspecified limb: Secondary | ICD-10-CM

## 2011-04-03 DIAGNOSIS — M79605 Pain in left leg: Secondary | ICD-10-CM

## 2011-04-03 DIAGNOSIS — I872 Venous insufficiency (chronic) (peripheral): Secondary | ICD-10-CM

## 2011-04-03 NOTE — Patient Instructions (Addendum)
I think the pain is coming from leg swelling, you have some venous insufficiency. Compression stockings when you wake up, elevate legs as much as possible, limit salt and ibuprofen, get plenty of fluid. Script for compression stockings provided today. Good to see you, call us with questions.  Return if worsening or not improving as expected.

## 2011-04-03 NOTE — Assessment & Plan Note (Signed)
Actually bilateral leg pain L>R.   Anticipate due to chronic venous insufficiency. Today better than yesterday - elevated legs more. New script for stockings at 30 mmHg provided today. Dopplers negative for dvt last week.

## 2011-04-03 NOTE — Progress Notes (Signed)
  Subjective:    Patient ID: Brooke [name], female    DOB: 04/02/1925, 77 y.o.   MRN: [id number]  HPI CC: Left leg pain  2wk h/o left leg pain.  Currently pain free, however when pain comes starts medial ankle and travels up leg medially to thigh.  Burning pain, soreness on inside.  H/o CVI and varicose veins.  Last night had leg swelling as well.  US done 1 wk ago with concern for DVT, negative for blood clot.  Using support stocking which has helped pain.  However, states that stocking has gotten lax, would like script for new one.  H/o CVI, h/o CVA 2002.  Keeping leg elevated as much as she can.  Getting plenty of water.  Limiting salt intake.  Previously injured left hip 10 yrs ago, when living in [place] was undergoing PT however ran out of money so hasn't gone back.  Long toenails, has trouble cutting.  Looking for new podiatrist.  Lives in [place].  Review of Systems Per HPI    Objective:   Physical Exam  Nursing note and vitals reviewed. Constitutional: She appears well-developed and well-nourished. No distress.  Musculoskeletal: Normal range of motion. She exhibits edema (mild pitting bilaterally).       Right ankle: Normal.       Left ankle: Normal.       Legs tender to palpation at muscles medial>lateral. No palp cords. Some varicose veins L leg.  Skin: Skin is warm and dry. No rash noted.  Psychiatric: She has a normal mood and affect.          Assessment & Plan:

## 2011-04-10 ENCOUNTER — Telehealth: Payer: Self-pay | Admitting: *Deleted

## 2011-04-10 NOTE — Telephone Encounter (Signed)
Thigh high 20-90mmHg.

## 2011-04-10 NOTE — Telephone Encounter (Signed)
Patient's son notified and will get the appropriate stockings.

## 2011-04-10 NOTE — Telephone Encounter (Signed)
Son says that Dr. Sharen Hones wrote a Rx for compression stockings.  They need this clarified as to what length (ie. Knee-high, thigh-high, etc.) and whether it should be closed toe or open toed.  You specified a 30 compression but the medical supply company says they run 20-30 and 30-40.  The are assuming it should be 20-30.  Is that okay?

## 2011-05-21 LAB — BASIC METABOLIC PANEL
BUN: 17
Calcium: 9.1
Creatinine, Ser: 0.89
GFR calc non Af Amer: >60
Glucose, Bld: 140 — ABNORMAL HIGH

## 2011-05-21 LAB — DIFFERENTIAL
Basophils Absolute: 0
Eosinophils Relative: 1
Lymphocytes Relative: 24
Neutro Abs: 4.9
Neutrophils Relative %: 67

## 2011-05-21 LAB — CBC
Platelets: 301
RDW: 12.9
WBC: 7.3

## 2011-05-28 ENCOUNTER — Telehealth: Payer: Self-pay | Admitting: Family Medicine

## 2011-05-28 ENCOUNTER — Ambulatory Visit (INDEPENDENT_AMBULATORY_CARE_PROVIDER_SITE_OTHER): Payer: Medicare Other | Admitting: Family Medicine

## 2011-05-28 ENCOUNTER — Encounter: Payer: Self-pay | Admitting: Family Medicine

## 2011-05-28 VITALS — BP 124/70 | HR 80 | Temp 98.2°F | Wt 118.0 lb

## 2011-05-28 DIAGNOSIS — R609 Edema, unspecified: Secondary | ICD-10-CM

## 2011-05-28 DIAGNOSIS — R600 Localized edema: Secondary | ICD-10-CM

## 2011-05-28 DIAGNOSIS — E039 Hypothyroidism, unspecified: Secondary | ICD-10-CM

## 2011-05-28 DIAGNOSIS — Z23 Encounter for immunization: Secondary | ICD-10-CM

## 2011-05-28 LAB — POCT URINALYSIS DIPSTICK
Glucose, UA: NEGATIVE
Ketones, UA: NEGATIVE
Protein, UA: NEGATIVE

## 2011-05-28 LAB — BASIC METABOLIC PANEL
Chloride: 98 mEq/L (ref 96–112)
Creatinine, Ser: 0.8 mg/dL (ref 0.4–1.2)
Potassium: 3.8 mEq/L (ref 3.5–5.1)
Sodium: 138 mEq/L (ref 135–145)

## 2011-05-28 LAB — TSH: TSH: 18.01 u[IU]/mL — ABNORMAL HIGH (ref 0.35–5.50)

## 2011-05-28 MED ORDER — LEVOTHYROXINE SODIUM 75 MCG PO TABS: ORAL_TABLET | ORAL | Status: DC

## 2011-05-28 NOTE — Telephone Encounter (Signed)
please notify urine looking ok. Kidneys looking normal.  May recommend trying lasix for leg swelling - no more than 1-2 x/wk.  Lasix is a water pill that will make her void more, recommend taking potassium when on lasix. Please notify thyroid function returned low which could be accounting for some swelling.  How long has she been taking 1/2 pill of the synthroid?  Would like her to increase dose of synthroid (levothyroxine) to 1 pill alternating with 1/2 pill daily (ie 1 pill, next day 1/2 pill, next day 1 pill, next day 1/2 pill etc) for 1 wk then may increase to 1 whole pill daily (goal synthroid dose will be daily.)  Would like her to return in 6 weeks for recheck of thyroid level.

## 2011-05-28 NOTE — Patient Instructions (Signed)
Blood work today to check on things.  If looking normal, may recommend using lasix more frequently. I think this is chronic venous insufficiency. Continue to use daily support stockings, elevate legs as much as able. I don't think there is any infection currently.  Let us know if swelling worsens or not improved.  Venous Stasis & Chronic Venous Insufficiency As people age, the veins located in their legs may weaken and stretch. When veins weaken and lose the ability to pump blood effectively, the condition is called chronic venous insufficiency (CVI) or venous stasis.  Almost all veins return blood back to the heart. This happens by:  The force of the heart pumping fresh blood pushes blood back to the heart.   Blood flowing to the heart from the force of gravity.  In the deep veins of the legs, blood has to fight gravity and flow upstream back to the heart. Here, the leg muscles contract to pump blood back toward the heart.  Vein walls are elastic, and many veins have small valves that only allow blood to flow in one direction. When leg muscles contract, they push inward against the elastic vein walls. This squeezes blood upward, opens the valves, and moves blood toward the heart. When leg muscles relax, the vein wall also relaxes and the valves inside the vein close to prevent blood from flowing backward. This method of pumping blood out of the legs is called the venous pump. CAUSES The venous pump works best while walking and leg muscles are contracting. But when a person sits or stands, blood pressure in leg veins can build. Deep veins are usually able to withstand short periods of inactivity, but long periods of inactivity (and increased pressure) can stretch, weaken, and damage vein walls.  High blood pressure can also stretch and damage vein walls. The veins may no longer be able to pump blood back to the heart. Venous hypertension (high blood pressure inside veins) that lasts over time is a  primary cause of CVI. CVI can also be caused by:   Deep vein thrombosis, a condition where a thrombus (blood clot) blocks blood flow in a vein.   Phlebitis, an inflammation of a superficial vein that causes a blood clot to form.  Other risk factors for CVI may include:   Heredity.   Obesity.   Pregnancy.   Sedentary lifestyle.   Smoking.   Jobs requiring long periods of standing or sitting in one place.   Age and gender:   Women in their 61's and 67's and men in their 55's are more prone to developing CVI.  SYMPTOMS Symptoms of CVI may include:   Varicose veins.   Ulceration or skin breakdown.   Lipodermatosclerosis, a condition that affects the skin just above the ankle, usually on the inside surface. Over time the skin becomes brown, smooth, tight and often painful. Those with this condition have a high risk of developing skin ulcers.   Reddened or discolored skin on the leg.   Swelling.  DIAGNOSIS Your caregiver can diagnose CVI after performing a careful medical history and physical examination. To confirm the diagnosis, the following tests may also be ordered:   Duplex ultrasound.   Plethysmography (tests blood flow).   Venograms (x-ray using a special dye).  TREATMENT The goals of treatment for CVI are to restore a person to an active life and to minimize pain or disability. Typically, CVI does not pose a serious threat to life or limb, and with proper treatment  most people with this condition can continue to lead active lives. In most cases, mild CVI can be treated on an outpatient basis with simple procedures. Treatment methods include:   Elastic compression socks.   Sclerotherapy, a procedure involving an injection of a material that "dissolves" the damaged veins. Other veins in the network of blood vessels take over the function of the damaged veins.   Vein stripping (an older procedure less commonly used).   Laser Ablation surgery.   Valve repair.    HOME CARE INSTRUCTIONS  Elastic compression socks must be worn every day. They can help with symptoms and lower the chances of the problem getting worse, but they do not cure the problem.   Only take over-the-counter or prescription medicines for pain, discomfort, or fever as directed by your caregiver.   Your caregiver will review your other medications with you.  SEEK MEDICAL CARE IF:  You are confused about how to take your medications.   There is redness, swelling, or increasing pain in the affected area.   There is a red streak or line that extends up or down from the affected area.   There is a breakdown or loss of skin in the affected area, even if the breakdown is small.   You develop an unexplained oral temperature above 101.   There is an injury to the affected area.  SEEK IMMEDIATE MEDICAL CARE IF:  There is an injury and open wound to the affected area.   Pain is not adequately relieved with pain medication prescribed or becomes severe.   An oral temperature above 101 develops.   The foot/ankle below the affected area becomes suddenly numb or the area feels weak and hard to move.  MAKE SURE YOU:   Understand these instructions.   Will watch your condition.   Will get help right away if you are not doing well or get worse.  Document Released: 12/22/2006 Document Re-Released: 07/31/2008 [facility] Patient Information 2011 [place], [place].

## 2011-05-28 NOTE — Progress Notes (Signed)
  Subjective:    Patient ID: Brooke [name], female    DOB: 10-01-1924, 77 y.o.   MRN: [id number]  HPI CC: Left leg pain   Left leg pain going on since 03/2011.  Currently pain free, however when pain comes starts medial ankle and travels up leg medially to thigh.  Burning pain, soreness on inside.  H/o CVI and varicose veins.  Last night had leg swelling as well.   Placed in new thigh high compression stocking, 20-88mmHg however not using, unable to put on, states were too tight.  H/o CVI, h/o CVA 2002. Keeping leg elevated as much as she can. Getting plenty of water. Limiting salt intake.   Previously injured left hip 10 yrs ago, when living in [place] was undergoing PT however ran out of money so hasn't gone back.   L leg unilateral doppler negative for DVT 03/26/2011.  Started on clindamycin bid after podiatrist cut nails recently.  Lives in [place].  Review of Systems Per HPI    Objective:   Physical Exam  Nursing note and vitals reviewed. Constitutional: She appears well-developed and well-nourished. No distress.  HENT:  Head: Normocephalic and atraumatic.  Cardiovascular: Normal rate, regular rhythm, normal heart sounds and intact distal pulses.   No murmur heard. Pulses:      Dorsalis pedis pulses are 2+ on the right side, and 2+ on the left side.       Posterior tibial pulses are 1+ on the right side, and 1+ on the left side.  Pulmonary/Chest: Effort normal and breath sounds normal. No respiratory distress. She has no wheezes. She has no rales.       Minimal crackles left basilarly  Musculoskeletal:       Trace to 1+ pitting edema bilateral legs. very tender to palpation L>R. L calf measures 30cm, R calf 29cm in diameter. Minimal erythema Left side sore medial leg up to mid thigh.  Right sore up to mid calf + varicose veins.  Skin: Skin is warm and dry. No rash noted.  Psychiatric: She has a normal mood and affect.          Assessment & Plan:

## 2011-05-28 NOTE — Assessment & Plan Note (Addendum)
bilateral leg pain and edema. H/o pacer, no h/o CHF. Anticipate CVI.   Using support stockings, unable to tolerate thigh high compression stockings. Check blood work today for swelling (cr and tsh) - if normal, consider scheduling lasix a few times a week for symptomatic relief. UA - no protein. Discussed elevation of legs, pt has been doing this.

## 2011-05-29 NOTE — Telephone Encounter (Signed)
Spoke with son and he verbalized understanding of new directions. He questioned if possibly she wasn't even taking her medication since results were so low. He said he would make sure she takes it from now on. Lab appt scheduled.

## 2011-05-29 NOTE — Telephone Encounter (Signed)
Detailed message left for son. Advised him of lab results. Advised him of medication instructions. Asked that he call me back to confirm that he understood instructions and to schedule repeat labs in 6 weeks.

## 2011-06-11 LAB — CEA: CEA: 2.6

## 2011-06-12 ENCOUNTER — Other Ambulatory Visit: Payer: Self-pay | Admitting: *Deleted

## 2011-06-12 LAB — DIFFERENTIAL
Band Neutrophils: 0
Basophils Absolute: 0
Basophils Relative: 0
Basophils Relative: 0
Basophils Relative: 1
Eosinophils Relative: 1
Eosinophils Relative: 2
Lymphocytes Relative: 33
Lymphs Abs: 1.6
Monocytes Absolute: 0.6
Monocytes Relative: 10
Monocytes Relative: 10
Monocytes Relative: 6
Neutro Abs: 2.6
Neutrophils Relative %: 54
Promyelocytes Absolute: 0

## 2011-06-12 LAB — TROPONIN I: Troponin I: 1.07

## 2011-06-12 LAB — URINALYSIS, ROUTINE W REFLEX MICROSCOPIC
Bilirubin Urine: NEGATIVE
Ketones, ur: NEGATIVE
Nitrite: NEGATIVE
Specific Gravity, Urine: 1.02
Urobilinogen, UA: 0.2

## 2011-06-12 LAB — URINE CULTURE: Colony Count: 25000

## 2011-06-12 LAB — BASIC METABOLIC PANEL
BUN: 7
CO2: 27
CO2: 29
CO2: 33 — ABNORMAL HIGH
Calcium: 8.5
Chloride: 103
GFR calc Af Amer: >60
GFR calc non Af Amer: >60
Glucose, Bld: 121 — ABNORMAL HIGH
Glucose, Bld: 122 — ABNORMAL HIGH
Potassium: 3.7
Potassium: 3.7
Potassium: 3.9
Sodium: 139
Sodium: 142
Sodium: 143
Sodium: 144

## 2011-06-12 LAB — CBC
HCT: 31.7 — ABNORMAL LOW
HCT: 34.4 — ABNORMAL LOW
HCT: 34.6 — ABNORMAL LOW
Hemoglobin: 11.4 — ABNORMAL LOW
Hemoglobin: 11.9 — ABNORMAL LOW
Hemoglobin: 12
MCHC: 34.1
MCHC: 34.5
MCHC: 34.7
MCV: 96.6
MCV: 97.4
Platelets: 306
RBC: 3.39 — ABNORMAL LOW
RBC: 3.56 — ABNORMAL LOW
RDW: 13.6
RDW: 13.9
WBC: 4.8
WBC: 5.2

## 2011-06-12 LAB — CARDIAC PANEL(CRET KIN+CKTOT+MB+TROPI)
CK, MB: 5.4 — ABNORMAL HIGH
Relative Index: 2.4
Troponin I: 0.6

## 2011-06-12 LAB — TSH: TSH: 0.668

## 2011-06-12 MED ORDER — LEVOTHYROXINE SODIUM 75 MCG PO TABS: ORAL_TABLET | ORAL | Status: DC

## 2011-07-02 ENCOUNTER — Encounter: Payer: Self-pay | Admitting: Family Medicine

## 2011-07-02 ENCOUNTER — Other Ambulatory Visit: Payer: Self-pay

## 2011-07-02 ENCOUNTER — Other Ambulatory Visit (INDEPENDENT_AMBULATORY_CARE_PROVIDER_SITE_OTHER): Payer: Medicare Other

## 2011-07-02 ENCOUNTER — Ambulatory Visit (INDEPENDENT_AMBULATORY_CARE_PROVIDER_SITE_OTHER): Payer: Medicare Other | Admitting: Family Medicine

## 2011-07-02 VITALS — BP 124/80 | HR 80 | Temp 98.3°F | Wt 114.1 lb

## 2011-07-02 DIAGNOSIS — E039 Hypothyroidism, unspecified: Secondary | ICD-10-CM

## 2011-07-02 DIAGNOSIS — R109 Unspecified abdominal pain: Secondary | ICD-10-CM | POA: Insufficient documentation

## 2011-07-02 DIAGNOSIS — R410 Disorientation, unspecified: Secondary | ICD-10-CM

## 2011-07-02 DIAGNOSIS — F29 Unspecified psychosis not due to a substance or known physiological condition: Secondary | ICD-10-CM

## 2011-07-02 LAB — POCT URINALYSIS DIPSTICK
Bilirubin, UA: NEGATIVE
Ketones, UA: NEGATIVE
Protein, UA: NEGATIVE

## 2011-07-02 LAB — TSH: TSH: 0.89 u[IU]/mL (ref 0.35–5.50)

## 2011-07-02 MED ORDER — CIPROFLOXACIN HCL 250 MG PO TABS: 250.0000 mg | ORAL_TABLET | Freq: Two times a day (BID) | ORAL | Status: AC

## 2011-07-02 MED ORDER — METOPROLOL SUCCINATE ER 25 MG PO TB24: 12.5000 mg | ORAL_TABLET | Freq: Every day | ORAL | Status: DC

## 2011-07-02 MED ORDER — LEVOTHYROXINE SODIUM 50 MCG PO TABS: ORAL_TABLET | ORAL | Status: DC

## 2011-07-02 MED ORDER — MECLIZINE HCL 25 MG PO TABS: 12.5000 mg | ORAL_TABLET | Freq: Two times a day (BID) | ORAL | Status: AC | PRN

## 2011-07-02 NOTE — Assessment & Plan Note (Signed)
Check TSH, free T4/T3 today.

## 2011-07-02 NOTE — Assessment & Plan Note (Signed)
abd pain with mild diarrhea per pt description.  Tylenol helps.  abd exam benign today, no tenderness. Some sxs could possibly be due to thyroid replacement - will recommend pt start 1/2 pill of levothyroxine, then will send in lower dose to pharmacy ( daily). Checked UA  - suspicious for infection - treat with cipro 250mg  twice daily for 3 days.  Update [place] if not improving.

## 2011-07-02 NOTE — Telephone Encounter (Signed)
[facility] faxed refill request for Meclizine 25 mg.Please advise.

## 2011-07-02 NOTE — Telephone Encounter (Signed)
Will refill electronically  

## 2011-07-02 NOTE — Patient Instructions (Addendum)
Urine checked today - looks like infection.  Take cipro 250mg  twice daily for 3 days.  Push fluids and cranberry juice.  Update [place] if not better after this.  Culture sent today. Thyroid checked today. We will call you with results. I do want you to restart thyroid, but only take 1/2 pill daily and new dose will be lower ( daily). It's ok to take tylenol 500mg  up to 3 times a day, but only as needed. Please let us know if not improving as expected. Ok to take lasix (water pill) only as needed.

## 2011-07-02 NOTE — Progress Notes (Signed)
  Subjective:    Patient ID: Brooke [name], female    DOB: 12/10/1924, 77 y.o.   MRN: [id number]  HPI CC: Diarrhea  Patient of Dr. Royden Purl presents with 4d history of loose stools, mild abd pain with nausea, some stool accidents, per son more confused.  Stopped taking levothyroxine as well as centrum silver and meclizine to see if would help diarrhea.  Urinating fine.  Denies dysuria, frequency, urgency, back pain, vomiting, fever/chills, blood in stool or urine.  No HA, SOB.  Seen here 05/28/2011 with leg swelling, found to have low thyroid, started on levothyroxine daily.  Leg swelling did improve on levothyroxine.  Had TSH rechecked today.  Also notes improved energy level on synthroid. Lab Results  Component Value Date   TSH 18.01* 05/28/2011   Touched base with son - concerned because pt forgot to turn off water faucet once this week, also states pt has been complaining of frequent urination and has h/o UTIs with confusion, but to me pt denies this, states more problems with mild diarrhea.  Per son, pt occasionally decides to stop meds on her own.  Review of Systems Per HPI    Objective:   Physical Exam  Nursing note and vitals reviewed. Constitutional: She appears well-developed and well-nourished. No distress.       Senile kyphosis.  HENT:  Head: Normocephalic and atraumatic.  Mouth/Throat: Oropharynx is clear and moist. No oropharyngeal exudate.  Eyes: Conjunctivae and EOM are normal. Pupils are equal, round, and reactive to light. No scleral icterus.  Neck: Normal range of motion. Neck supple.  Cardiovascular: Normal rate, regular rhythm, normal heart sounds and intact distal pulses.   No murmur heard. Pulmonary/Chest: Effort normal and breath sounds normal. No respiratory distress. She has no wheezes. She has no rales.  Abdominal: Soft. Bowel sounds are normal. She exhibits no distension. There is no tenderness. There is no rebound and no guarding.  Musculoskeletal: She  exhibits no edema.       Tender LE bilaterally to touch, L>R  Skin: Skin is warm and dry. No rash noted.  Psychiatric: She has a normal mood and affect.      Assessment & Plan:

## 2011-07-05 LAB — URINE CULTURE: Colony Count: >=100000

## 2011-08-06 ENCOUNTER — Emergency Department: Payer: Self-pay | Admitting: Emergency Medicine

## 2011-08-15 ENCOUNTER — Telehealth: Payer: Self-pay | Admitting: Internal Medicine

## 2011-08-15 MED ORDER — POTASSIUM CHLORIDE 10 MEQ PO TBCR: 10.0000 meq | EXTENDED_RELEASE_TABLET | Freq: Every day | ORAL | Status: DC

## 2011-08-15 NOTE — Telephone Encounter (Signed)
Son called and stated she will not take her furosemide without a Rx for potassium.  She is swollen in her legs.  Please advise.

## 2011-08-15 NOTE — Telephone Encounter (Signed)
That is fine - we have one on file Since no pharmacy was mentioned, Px written for call in   Let me know if no improvement

## 2011-08-15 NOTE — Telephone Encounter (Signed)
rx sent to pharmacy by e-script, [facility]

## 2011-09-30 ENCOUNTER — Encounter: Payer: Self-pay | Admitting: Family Medicine

## 2011-09-30 ENCOUNTER — Ambulatory Visit (INDEPENDENT_AMBULATORY_CARE_PROVIDER_SITE_OTHER): Payer: Medicare Other | Admitting: Family Medicine

## 2011-09-30 VITALS — BP 160/86 | HR 88 | Temp 97.7°F | Wt 116.5 lb

## 2011-09-30 DIAGNOSIS — B353 Tinea pedis: Secondary | ICD-10-CM

## 2011-09-30 DIAGNOSIS — I10 Essential (primary) hypertension: Secondary | ICD-10-CM

## 2011-09-30 DIAGNOSIS — R109 Unspecified abdominal pain: Secondary | ICD-10-CM

## 2011-09-30 DIAGNOSIS — M79605 Pain in left leg: Secondary | ICD-10-CM

## 2011-09-30 DIAGNOSIS — M79609 Pain in unspecified limb: Secondary | ICD-10-CM

## 2011-09-30 LAB — POCT URINALYSIS DIPSTICK
Blood, UA: NEGATIVE
Glucose, UA: NEGATIVE
Nitrite, UA: NEGATIVE
Protein, UA: NEGATIVE
Spec Grav, UA: 1.01
Urobilinogen, UA: 0.2

## 2011-09-30 NOTE — Progress Notes (Addendum)
  Subjective:    Patient ID: Brooke [name], female    DOB: [birth year], 78 y.o.   MRN: [id number]  HPI CC: leg pain  78 yo pt of Dr. Royden Purl presents with concerns about leg swelling.  Yesterday R leg got swollen, this scared her.  Today it's better.  H/o L leg pain more, previous evaluations thought 2/2 CVI.  Has had PT in past which helped, but ran out of money so unable to continue.  R leg stays sore.  Has been keeping left leg elevated, but not right side.  Seen at ER with abd pain 4 wks ago, told had constipation.  Given docusate which caused diarrhea.  No problems since.  Having lower back pain on left side.  Actually having more right leg pain than left leg pain.  No leg numbness/tingling.  No weakness.  Endorses dysuria "on the outside".  Voiding more than normal.  H/o UTI presenting as confusion.  Drinking more cranberry juice recently.  BP elevated today - reports compliance with metoprolol XL 50mg  1/2 pill daily, states has not been taking lasix or hctz.  Difficult to ascertain what meds she is actually taking.  L leg unilateral doppler negative for DVT 03/26/2011.  No fevers/chills, abd pain, n/v, new rashes.  Wt Readings from Last 3 Encounters:  09/30/11 116 lb 8 oz (52.844 kg)  07/02/11 114 lb 1.9 oz (51.764 kg)  05/28/11 118 lb (53.524 kg)   Past Medical History  Diagnosis Date  . Orthodromic reciprocating tachycardia     bradycardia  . Hypertension   . Cardiac pacemaker in situ   . Thyroid disease   . Allergy   . Depression   . GERD (gastroesophageal reflux disease)   . Cerebrovascular accident   . Osteoporosis   . Mastoiditis, chronic   . Eczema   . Hearing loss   . Chronic foot pain   . Melanoma     Review of Systems Per HPI    Objective:   Physical Exam  Nursing note and vitals reviewed. Constitutional: She appears well-developed and well-nourished. No distress.  Cardiovascular:  Pulses:      Dorsalis pedis pulses are 2+ on the right side, and 2+ on  the left side.       Posterior tibial pulses are 2+ on the right side, and 2+ on the left side.  Abdominal: Soft. Bowel sounds are normal. She exhibits no mass. There is no tenderness. There is no rebound and no guarding.  Musculoskeletal: She exhibits edema (tr pedal edema bilaterally).       Mild midline lumbar spine tenderness. Greater left tenderness at sciatic notch. No pain at GTB or SIJ bilaterally. No leg weakness or loss of sensation. No calf pain, no palpable cords, no asymmetry  Skin: Skin is warm and dry.       Some CVI changes, mild hyperpigmentation BLE Maceration between toes bilateral feet   Compression stockings on - removed.    Assessment & Plan:

## 2011-09-30 NOTE — Assessment & Plan Note (Signed)
Tender to palpation at sciatic notch.  Anticipate component of sciatica, treat with tylenol prn.  Update [place] if not improving. Good pulses bilaterally.  No significant edema today.  Anticipate continued CVI, rec continued elevation, use of compression stockings. UA normal today.

## 2011-09-30 NOTE — Assessment & Plan Note (Signed)
rec start HCTZ regularly, then use lasix prn. Lab Results  Component Value Date   CREATININE 0.8 05/28/2011   f/u with PCP 1 mo.

## 2011-09-30 NOTE — Assessment & Plan Note (Signed)
Mild between toes. Has lotrimin on med list, rec start using between toes bid for 2 wks.

## 2011-09-30 NOTE — Patient Instructions (Addendum)
Return in 1 month for follow up with Dr. Milinda Antis on blood pressure.  Take hydrochlrothiazide daily, may take lasix as well as needed. Urine checked today - looking normal. You have fungal infection between toes.  Use lotrimin twice daily there. I think you have sciatic nerve irritation - take tylenol 500mg  to 1000mg  1-2 times daily for this.  If not improving, let us know. Good to see you today, call us with questions.

## 2011-10-15 ENCOUNTER — Other Ambulatory Visit: Payer: Self-pay | Admitting: Family Medicine

## 2011-10-15 MED ORDER — HYDROCHLOROTHIAZIDE 25 MG PO TABS: 25.0000 mg | ORAL_TABLET | Freq: Every day | ORAL | Status: DC

## 2011-10-15 MED ORDER — LEVOTHYROXINE SODIUM 50 MCG PO TABS: ORAL_TABLET | ORAL | Status: DC

## 2011-10-15 NOTE — Telephone Encounter (Signed)
Both medications filled and sent to the pharmacy electronically.

## 2011-10-15 NOTE — Telephone Encounter (Signed)
Rx refill for Hydrochlorothizide 25mg , and his thyroid  Medication. Called pt to confirm the name of the meds left message.

## 2011-11-14 ENCOUNTER — Other Ambulatory Visit: Payer: Self-pay | Admitting: Family Medicine

## 2012-01-29 ENCOUNTER — Encounter: Payer: Self-pay | Admitting: Family Medicine

## 2012-01-29 ENCOUNTER — Ambulatory Visit (INDEPENDENT_AMBULATORY_CARE_PROVIDER_SITE_OTHER)
Admission: RE | Admit: 2012-01-29 | Discharge: 2012-01-29 | Disposition: A | Discharge status: Home / Self Care | Payer: Medicare Other | Source: Ambulatory Visit | Attending: Family Medicine | Admitting: Family Medicine

## 2012-01-29 ENCOUNTER — Ambulatory Visit (INDEPENDENT_AMBULATORY_CARE_PROVIDER_SITE_OTHER): Payer: Medicare Other | Admitting: Family Medicine

## 2012-01-29 VITALS — BP 110/68 | HR 80 | Temp 97.4°F | Wt 113.0 lb

## 2012-01-29 DIAGNOSIS — R0781 Pleurodynia: Secondary | ICD-10-CM

## 2012-01-29 DIAGNOSIS — R079 Chest pain, unspecified: Secondary | ICD-10-CM

## 2012-01-29 NOTE — Assessment & Plan Note (Signed)
Tender to palpation at anterior ribs, anticipate at minimum rib bruise. CXR on my read - unable to appreciate fracture, but significant decreased bone density. Treat with tylenol 1000mg  bid scheduled as this helped in past.  Update [place] if worsening. Pacemaker seems intact. rec f/u with PCP re med refills.

## 2012-01-29 NOTE — Progress Notes (Signed)
  Subjective:    Patient ID: Brooke [name], female    DOB: 1924-11-30, 78 y.o.   MRN: [id number]  HPI CC: rib pain after fall  Has not seen PCP since 03/2011.  Last seen by myself 09/2011 for leg pains.  Last visit noted to have elevated blood pressure to 160/90, advised to follow up with PCP in 1 mo, did not.  Requests 90 day supply of meds today.  Advised I would only refill temporary course as pt needs regular f/u with PCP.  Reviewed all medicines - actually doesn't need any refilled.  Has 3+ refills on all her meds.  Presents today because having rib pain on right side.  Had fall 2 years ago with rib fracture then.  That resolved with time.  2-3 weeks ago fell while changing pants to get ready to go out.  Fell on left side of body.  Did have bruising left upper back and abrasion.  Ambulance called and advised ER but pt refused.  Since then having rib pain on right side - at site where previously broke ribs (which is opposite side of where she fell).    Taking 2 tylenol for pain but not scheduled.  Has not been taking lasix and feels swelling.  Takes on prn basis.  Past Medical History  Diagnosis Date  . Other specified cardiac dysrhythmias     bradycardia  . Hypertension   . Cardiac pacemaker in situ   . Thyroid disease   . Allergy   . Depression   . GERD (gastroesophageal reflux disease)   . Cerebrovascular accident   . Osteoporosis   . Mastoiditis, chronic   . Eczema   . Hearing loss   . Chronic foot pain   . Melanoma      Review of Systems Per HPI    Objective:   Physical Exam  Nursing note and vitals reviewed. Constitutional: She appears well-developed and well-nourished. No distress.  HENT:  Head: Normocephalic and atraumatic.  Mouth/Throat: Oropharynx is clear and moist. No oropharyngeal exudate.  Eyes: Conjunctivae and EOM are normal. Pupils are equal, round, and reactive to light. No scleral icterus.  Neck: Normal range of motion. Neck supple.  Cardiovascular:  Normal rate, regular rhythm, normal heart sounds and intact distal pulses.   No murmur heard. Pulmonary/Chest: Effort normal and breath sounds normal. No respiratory distress. She has no wheezes. She has no rales. She exhibits tenderness. She exhibits no mass, no laceration, no deformity, no swelling and no retraction.       Tender to palpation right anterior superior and inferior ribs. Tender at left pacemaker site but no erythema or swelling or evident displacement  Musculoskeletal: She exhibits no edema.       Resolving bruise left upper back  Lymphadenopathy:    She has no cervical adenopathy.  Skin: Skin is warm and dry. No rash noted.  Psychiatric: She has a normal mood and affect.       Assessment & Plan:

## 2012-01-29 NOTE — Patient Instructions (Addendum)
Return to see Dr. Milinda Antis in 1 month for follow up and for med refills as you haven't seen her since 03/2011. Take tylenol 2 pills twice a day every day for rib pain.  i think this will help with pain.  If not better after this, let us know. Lungs and heart are sounding good today.  Pacemaker site looks ok today. Xray today.  We will call you if it changes our plan of treatment.

## 2012-01-30 ENCOUNTER — Telehealth: Payer: Self-pay

## 2012-01-30 NOTE — Telephone Encounter (Signed)
Pt left v/m requesting xray results.

## 2012-01-30 NOTE — Telephone Encounter (Signed)
Left message on cell phone voicemail advising patient that xray results were not back yet.

## 2012-02-02 NOTE — Telephone Encounter (Signed)
I still do not see a reading on this? - will forward to Dr G who saw her

## 2012-02-02 NOTE — Telephone Encounter (Signed)
plz notify I don't see any fractures, but surprisingly it hasn't been read by radiologist yet   Can we check with rads to see why not read yet?  Thanks.

## 2012-02-02 NOTE — Telephone Encounter (Signed)
Terri-can you check on this and let me know. Thanks!

## 2012-02-03 NOTE — Telephone Encounter (Signed)
Patient's son notified. He will make sure she is taking calcium and vitamin d and that she keeps appt with Dr. Milinda Antis.

## 2012-02-03 NOTE — Telephone Encounter (Signed)
Please notify that no frank rib fracture was found, however she did have what seemed like 2 old compression fractures of mid back.  Would like her to f/u with PCP regarding these findings as well as ensure taking in adequate calcium and vitamin D daily (goal 1200mg  calcium, 800 IU vit D - 1 glass of milk or calcium fortified orange juice = 300mg ).  Will route to PCP as fyi as well.

## 2012-03-01 ENCOUNTER — Encounter: Payer: Self-pay | Admitting: Family Medicine

## 2012-03-01 ENCOUNTER — Ambulatory Visit (INDEPENDENT_AMBULATORY_CARE_PROVIDER_SITE_OTHER): Payer: Medicare Other | Admitting: Family Medicine

## 2012-03-01 VITALS — BP 145/82 | Temp 97.9°F | Wt 113.5 lb

## 2012-03-01 DIAGNOSIS — I10 Essential (primary) hypertension: Secondary | ICD-10-CM

## 2012-03-01 DIAGNOSIS — R600 Localized edema: Secondary | ICD-10-CM

## 2012-03-01 DIAGNOSIS — R609 Edema, unspecified: Secondary | ICD-10-CM

## 2012-03-01 DIAGNOSIS — Z23 Encounter for immunization: Secondary | ICD-10-CM

## 2012-03-01 DIAGNOSIS — E039 Hypothyroidism, unspecified: Secondary | ICD-10-CM

## 2012-03-01 DIAGNOSIS — M810 Age-related osteoporosis without current pathological fracture: Secondary | ICD-10-CM

## 2012-03-01 DIAGNOSIS — E785 Hyperlipidemia, unspecified: Secondary | ICD-10-CM

## 2012-03-01 LAB — CBC WITH DIFFERENTIAL/PLATELET
Basophils Absolute: 0 10*3/uL (ref 0.0–0.1)
Eosinophils Absolute: 0.1 10*3/uL (ref 0.0–0.7)
Lymphocytes Relative: 32.1 % (ref 12.0–46.0)
MCHC: 33.9 g/dL (ref 30.0–36.0)
MCV: 93.8 fl (ref 78.0–100.0)
Monocytes Absolute: 0.6 10*3/uL (ref 0.1–1.0)
Neutrophils Relative %: 52.9 % (ref 43.0–77.0)
Platelets: 274 10*3/uL (ref 150.0–400.0)
RBC: 3.84 Mil/uL — ABNORMAL LOW (ref 3.87–5.11)
RDW: 13.4 % (ref 11.5–14.6)

## 2012-03-01 LAB — COMPREHENSIVE METABOLIC PANEL
ALT: 18 U/L (ref 0–35)
AST: 32 U/L (ref 0–37)
Albumin: 4.2 g/dL (ref 3.5–5.2)
Alkaline Phosphatase: 47 U/L (ref 39–117)
BUN: 13 mg/dL (ref 6–23)
Calcium: 10.1 mg/dL (ref 8.4–10.5)
Chloride: 91 mEq/L — ABNORMAL LOW (ref 96–112)
Potassium: 4.3 mEq/L (ref 3.5–5.1)
Sodium: 131 mEq/L — ABNORMAL LOW (ref 135–145)
Total Protein: 7.9 g/dL (ref 6.0–8.3)

## 2012-03-01 LAB — LIPID PANEL
Cholesterol: 187 mg/dL (ref 0–200)
LDL Cholesterol: 93 mg/dL (ref 0–99)
Total CHOL/HDL Ratio: 3
VLDL: 21.4 mg/dL (ref 0.0–40.0)

## 2012-03-01 MED ORDER — METOPROLOL SUCCINATE ER 25 MG PO TB24: ORAL_TABLET | ORAL | Status: AC

## 2012-03-01 MED ORDER — LEVOTHYROXINE SODIUM 50 MCG PO TABS: ORAL_TABLET | ORAL | Status: AC

## 2012-03-01 NOTE — Assessment & Plan Note (Signed)
Pt stopped her fluid pills entirely since her supp hose control edema bp is fair today Labs today Also off the K  Continues the toprol

## 2012-03-01 NOTE — Assessment & Plan Note (Signed)
Vit D level today- pt not taking ca or D due to swallowing issues with large pills I suggested the chewable ones  Has kyphosis and some hx of comp fx

## 2012-03-01 NOTE — Assessment & Plan Note (Signed)
Much improved - pt wearing supp hose and no longer thinks she needs to use diuretics  Will continue to watch

## 2012-03-01 NOTE — Patient Instructions (Addendum)
I sent your medicines to the pharmacy I took the fluid pills off your medicine list  tetnus shot today Labs today  Try to stay active and always use your walker  Follow up with me in about 6 months please

## 2012-03-01 NOTE — Assessment & Plan Note (Signed)
Labs today Hx of CAD Is diet controlled  Appetite is less -eating less fats  For cardiol f/u soon

## 2012-03-01 NOTE — Progress Notes (Signed)
Subjective:    Patient ID: Brooke [name], female    DOB: April 25, 1925, 78 y.o.   MRN: [id number]  HPI Here for f/u of chronic health problems  Last visit was here for rib contusion cxr showed some comp fx/  OP  Now resides at home place - is ok , and uses walker all the time  Feels safer -- doing fine  She notices a lot of incontinence    ? Due for Td   Due for labs  bp is  145/82    Today On hctz-- then lasix when she needs it for edema   (pt thinks she does not need it )  No cp or palpitations or headaches or edema  No side effects to medicines    Lipids Lab Results  Component Value Date   CHOL 206* 10/08/2010   HDL 64.30 10/08/2010   LDLDIRECT 121.4 10/08/2010   TRIG 134.0 10/08/2010   CHOLHDL 3 10/08/2010   diet controlled  Hypothyroid Lab Results  Component Value Date   TSH 0.89 07/02/2011  feels about the same  No hair or skin or energy changes  Needs px for 3 mo  Patient Active Problem List  Diagnosis  . HYPERTENSION, UNSPECIFIED  . BRADYCARDIA  . PACEMAKER, PERMANENT  . POSTHERPETIC NEURALGIA  . FUNGAL DERMATITIS  . HYPOTHYROIDISM  . DEPRESSION  . CORONARY ARTERY DISEASE  . ALLERGIC RHINITIS  . GERD  . FOOT PAIN, CHRONIC  . OSTEOPOROSIS  . CEREBROVASCULAR ACCIDENT, HX OF  . Hyperlipidemia  . Dizzy  . Left leg pain  . Edema leg  . Abdominal  pain, other specified site  . Tinea pedis  . Rib pain   Past Medical History  Diagnosis Date  . Other specified cardiac dysrhythmias     bradycardia  . Hypertension   . Cardiac pacemaker in situ   . Thyroid disease   . Allergy   . Depression   . GERD (gastroesophageal reflux disease)   . Cerebrovascular accident   . Osteoporosis   . Mastoiditis, chronic   . Eczema   . Hearing loss   . Chronic foot pain   . Melanoma    Past Surgical History  Procedure Date  . Colon surgery    History  Substance Use Topics  . Smoking status: Never Smoker   . Smokeless tobacco: Not on file  . Alcohol Use: No    Family History  Problem Relation Age of Onset  . Alcohol abuse Father   . Heart disease Father   . Alcohol abuse Brother    Allergies  Allergen Reactions  . Penicillins     REACTION: arm swelled  . Pneumococcal Vaccine Polyvalent     REACTION: swelling  . Propoxyphene-Acetaminophen     REACTION: confused pt  . Sulfonamide Derivatives     REACTION: nervousness  . Zantac (Ranitidine Hcl) Diarrhea   Current Outpatient Prescriptions on File Prior to Visit  Medication Sig Dispense Refill  . aspirin 81 MG tablet Take 81 mg by mouth daily.        . calcium carbonate (TUMS) 500 MG chewable tablet Chew 1 tablet by mouth daily.        . clotrimazole (LOTRIMIN) 1 % cream Apply topically as needed.        . docusate sodium (COLACE) 100 MG capsule Take 100 mg by mouth daily.      . furosemide (LASIX) 20 MG tablet Take 20 mg by mouth as needed.       Marland Kitchen  hydrochlorothiazide (HYDRODIURIL) 25 MG tablet Take 1 tablet (25 mg total) by mouth daily.  30 tablet  6  . levothyroxine (SYNTHROID, LEVOTHROID) 50 MCG tablet One tablet by mouth daily  30 tablet  6  . loratadine (CLARITIN) 10 MG tablet Take 10 mg by mouth daily.        . meclizine (ANTIVERT) 25 MG tablet Take 0.5 tablets (12.5 mg total) by mouth 2 (two) times daily as needed for dizziness.  30 tablet  1  . metoprolol succinate (TOPROL-XL) 25 MG 24 hr tablet TAKE ONE-HALF TABLET BY MOUTH DAILY AT DINNER  45 tablet  3  . Multiple Vitamin (MULTIVITAMIN) capsule Take 1 capsule by mouth daily.        Marland Kitchen nystatin (MYCOSTATIN) cream Apply to affected area under breast for rash two times a day as needed       . potassium chloride (KLOR-CON) 10 MEQ CR tablet Take 1 tablet (10 mEq total) by mouth daily. With lasix  30 tablet  5     Review of Systems  Constitutional: Negative for fever, appetite change, fatigue and unexpected weight change.  Eyes: Negative for pain and visual disturbance.  Respiratory: Negative for cough and shortness of breath.    Cardiovascular: Negative for cp or palpitations    Gastrointestinal: Negative for nausea, diarrhea and constipation.  Genitourinary: Negative for urgency and pos for some incontinence at times  Skin: Negative for pallor or rash   Neurological: Negative for weakness, light-headedness, numbness and headaches.  Hematological: Negative for adenopathy. Does not bruise/bleed easily.  Psychiatric/Behavioral: Negative for dysphoric mood. The patient is not nervous/anxious.       Review of Systems     Objective:   Physical Exam  Constitutional: She appears well-developed and well-nourished. No distress.       Frail appearing elderly female with walker   HENT:  Head: Normocephalic and atraumatic.  Mouth/Throat: Oropharynx is clear and moist.  Eyes: Conjunctivae and EOM are normal. Pupils are equal, round, and reactive to light. No scleral icterus.  Neck: Normal range of motion. Neck supple. No JVD present. Carotid bruit is not present. No thyromegaly present.  Cardiovascular: Normal rate, regular rhythm, normal heart sounds and intact distal pulses.  Exam reveals no gallop.   Pulmonary/Chest: Effort normal and breath sounds normal. No respiratory distress. She has no wheezes.  Abdominal: Soft. Bowel sounds are normal. She exhibits no distension, no abdominal bruit and no mass. There is no tenderness.  Musculoskeletal: She exhibits no edema and no tenderness.       Varicosities noted   Lymphadenopathy:    She has no cervical adenopathy.  Neurological: She is alert. She has normal reflexes. No cranial nerve deficit. Coordination normal.  Skin: Skin is warm and dry. No rash noted. No erythema. No pallor.  Psychiatric: She has a normal mood and affect.       Pleasant and talkative          Assessment & Plan:

## 2012-03-01 NOTE — Assessment & Plan Note (Signed)
Lab today for tsh Per pt compliant with her meds No clinical changes

## 2012-03-03 ENCOUNTER — Telehealth: Payer: Self-pay | Admitting: Family Medicine

## 2012-03-03 ENCOUNTER — Ambulatory Visit (INDEPENDENT_AMBULATORY_CARE_PROVIDER_SITE_OTHER): Payer: Medicare Other | Admitting: Family Medicine

## 2012-03-03 ENCOUNTER — Encounter: Payer: Self-pay | Admitting: Family Medicine

## 2012-03-03 VITALS — BP 146/74 | HR 73 | Temp 97.6°F | Wt 113.0 lb

## 2012-03-03 DIAGNOSIS — T8090XA Unspecified complication following infusion and therapeutic injection, initial encounter: Secondary | ICD-10-CM

## 2012-03-03 DIAGNOSIS — N390 Urinary tract infection, site not specified: Secondary | ICD-10-CM

## 2012-03-03 DIAGNOSIS — T888XXA Other specified complications of surgical and medical care, not elsewhere classified, initial encounter: Secondary | ICD-10-CM

## 2012-03-03 LAB — POCT URINALYSIS DIPSTICK
Glucose, UA: NEGATIVE
Spec Grav, UA: 1.01
Urobilinogen, UA: 0.2
pH, UA: 7

## 2012-03-03 MED ORDER — CIPROFLOXACIN HCL 250 MG PO TABS: 250.0000 mg | ORAL_TABLET | Freq: Two times a day (BID) | ORAL | Status: AC

## 2012-03-03 NOTE — Patient Instructions (Signed)
You have a urinary tract infection  I sent px for [facility] (antibiotic) to [facility]- start it now and take it twice daily with food  If you get worse urinary symptoms or confusion or back pain or fever call asap or seek care Use a cold compress where you had the tetnus shot to help with pain and itchiness until it resolves Update me with how you are feeling on Friday or Monday

## 2012-03-03 NOTE — Telephone Encounter (Signed)
Caller: Mike/Child; PCP: Tower, Marne A.; CB#: [phone]; Call regarding Urinary incontinence;  Onset: approx 02/25/12.  Afebrile.  Cleon Thoma at [phone] for triage.  Also has itching and red at site of Tetanus vaccination given on 03/01/12. Unable to urinate this morning; no urge to urinate but leaks scant amount when tries.  Bladder pressure present. Been drinking fluids.  Difficult to triage d/t  very hard of hearing. Advised to see MD within 4 hrs for no urination for > 8 hrs for Urinary Symptoms.  Needs 30 min for travel. Appt scheduled for 1530 03/03/12 with Dr Milinda Antis.

## 2012-03-03 NOTE — Telephone Encounter (Signed)
Will see her at her appt

## 2012-03-03 NOTE — Progress Notes (Signed)
Subjective:    Patient ID: Brooke [name], female    DOB: 16-Sep-1924, 78 y.o.   MRN: [id number]  HPI Here with pain in her neck and shoulders  Had pain in arm after her Td shot at last visit  Sore but not red or swollen   Her urine incontinence stopped  Having trouble passing her urine  Bladder feels full  Just dribbles a little when she goes  Also some burning   Took some robitussin DM over the counter  She has an occ cough from allergies  No other otc meds  Just feels generally lousy  Almost felt feverish yesterday, but no fever  Patient Active Problem List  Diagnosis  . HYPERTENSION, UNSPECIFIED  . BRADYCARDIA  . PACEMAKER, PERMANENT  . POSTHERPETIC NEURALGIA  . FUNGAL DERMATITIS  . HYPOTHYROIDISM  . DEPRESSION  . CORONARY ARTERY DISEASE  . ALLERGIC RHINITIS  . GERD  . FOOT PAIN, CHRONIC  . OSTEOPOROSIS  . CEREBROVASCULAR ACCIDENT, HX OF  . Hyperlipidemia  . Dizzy  . Left leg pain  . Edema leg  . Abdominal  pain, other specified site  . Tinea pedis  . Rib pain  . UTI (lower urinary tract infection)   Past Medical History  Diagnosis Date  . Other specified cardiac dysrhythmias     bradycardia  . Hypertension   . Cardiac pacemaker in situ   . Thyroid disease   . Allergy   . Depression   . GERD (gastroesophageal reflux disease)   . Cerebrovascular accident   . Osteoporosis   . Mastoiditis, chronic   . Eczema   . Hearing loss   . Chronic foot pain   . Melanoma    Past Surgical History  Procedure Date  . Colon surgery    History  Substance Use Topics  . Smoking status: Never Smoker   . Smokeless tobacco: Not on file  . Alcohol Use: No   Family History  Problem Relation Age of Onset  . Alcohol abuse Father   . Heart disease Father   . Alcohol abuse Brother    Allergies  Allergen Reactions  . Penicillins     REACTION: arm swelled  . Pneumococcal Vaccine Polyvalent     REACTION: swelling  . Propoxyphene-Acetaminophen     REACTION:  confused pt  . Sulfonamide Derivatives     REACTION: nervousness  . Zantac (Ranitidine Hcl) Diarrhea   Current Outpatient Prescriptions on File Prior to Visit  Medication Sig Dispense Refill  . calcium carbonate (TUMS) 500 MG chewable tablet Chew 1 tablet by mouth daily.        . clotrimazole (LOTRIMIN) 1 % cream Apply topically as needed.        . docusate sodium (COLACE) 100 MG capsule Take 100 mg by mouth daily.      Marland Kitchen levothyroxine (SYNTHROID, LEVOTHROID) 50 MCG tablet One tablet by mouth daily  90 tablet  3  . loratadine (CLARITIN) 10 MG tablet Take 10 mg by mouth daily.        . meclizine (ANTIVERT) 25 MG tablet Take 0.5 tablets (12.5 mg total) by mouth 2 (two) times daily as needed for dizziness.  30 tablet  1  . metoprolol succinate (TOPROL-XL) 25 MG 24 hr tablet Take 1/2 tablet by mouth once daily at dinner  45 tablet  3  . Multiple Vitamin (MULTIVITAMIN) capsule Take 1 capsule by mouth daily.        Marland Kitchen nystatin (MYCOSTATIN) cream Apply to affected area  under breast for rash two times a day as needed       . aspirin 81 MG tablet Take 81 mg by mouth daily.              Review of Systems Review of Systems  Constitutional: Negative for fever, appetite change,  and unexpected weight change. pos for fatigue and general malaise  Eyes: Negative for pain and visual disturbance.  Respiratory: Negative for shortness of breath. Pos for occ cough from post nasal drip   Cardiovascular: Negative for cp or palpitations    Gastrointestinal: Negative for nausea, diarrhea and constipation.  Genitourinary: pos for urgency and frequency (passing just small amt of urine at a time) neg for blood in urine or n/v Skin: Negative for pallor or rash   MSK pos for pain at site of Td, also general achyness  Neurological: Negative for weakness, light-headedness, numbness and headaches.  Hematological: Negative for adenopathy. Does not bruise/bleed easily.  Psychiatric/Behavioral: Negative for dysphoric  mood. The patient is not nervous/anxious.         Objective:   Physical Exam  Constitutional: She appears well-developed and well-nourished. No distress.       Fatigued appearing elderly female  HENT:  Head: Normocephalic and atraumatic.  Mouth/Throat: Oropharynx is clear and moist.  Eyes: Conjunctivae and EOM are normal. Pupils are equal, round, and reactive to light. No scleral icterus.  Neck: Normal range of motion. Neck supple. No JVD present. No thyromegaly present.  Cardiovascular: Normal rate and regular rhythm.   Pulmonary/Chest: Effort normal and breath sounds normal.  Abdominal: Soft. Bowel sounds are normal. She exhibits no distension and no mass. There is tenderness. There is no rebound and no guarding.       Very slight suprapubic tenderness  Musculoskeletal: Normal range of motion. She exhibits tenderness. She exhibits no edema.       Tender over site of Td shot in L upper arm No swelling/ redness/ warmth or skin change   No cva tenderness  Lymphadenopathy:    She has no cervical adenopathy.  Neurological: She is alert. She has normal reflexes.  Skin: Skin is warm and dry. No rash noted. No erythema. No pallor.  Psychiatric: She has a normal mood and affect.       Pt confuses more easily than usual, some tangential thought          Assessment & Plan:

## 2012-03-04 DIAGNOSIS — T8090XA Unspecified complication following infusion and therapeutic injection, initial encounter: Secondary | ICD-10-CM | POA: Insufficient documentation

## 2012-03-04 LAB — POCT UA - MICROSCOPIC ONLY

## 2012-03-04 NOTE — Assessment & Plan Note (Signed)
Causing urinary hesitancy, dysuria and frequency- also malaise and some confusion  tx with cipro -disc poss side eff Urine sent for cx (if enough sample is present)  Pend report  Adv pt to update asap if worse or not imp  Son is aware also

## 2012-03-04 NOTE — Assessment & Plan Note (Signed)
From recent Td Sore but without swelling/ redness/ fever or other changes  Adv to use tylenol prn and also cool/warm compresses Update if not starting to improve in a week or if worsening

## 2012-03-05 LAB — URINE CULTURE: Colony Count: NO GROWTH

## 2012-03-12 ENCOUNTER — Telehealth: Payer: Self-pay

## 2012-03-12 NOTE — Telephone Encounter (Signed)
Informed patient as instructed

## 2012-03-12 NOTE — Telephone Encounter (Signed)
Spoke with pts son; pt had constipated hard BM on Wed night; one small constipated BM since Wed not sure when.Pain has pain in right side; was seen 03/01/12 and 03/03/12. P t has finished Cipro. No fever and no vomiting.last night RN from [facility] where pt is living called pt son; pt having panic attack; crying pt afraid but does not know why afraid, very anxious, depressed and insecure. Pt stated felt like she was slowly dying. Today crying and anxiousness is better but still acting depressed. Pts son said pt has appt with Dr Milinda Antis 03/16/12 and does not think pt will see another dr sooner.[employer].Please advise.

## 2012-03-12 NOTE — Telephone Encounter (Signed)
It would be ok to try miralax for the constipation -once daily as directed until stools improve, and she can continue her stool softener also  Make sure she is getting enough fluids please If abd pain or n/v go to ER We will disc her mood issues at her appt - to disc anxiety and ? Poss depression  She may want to bring her son with her for that appt into the room

## 2012-03-12 NOTE — Telephone Encounter (Signed)
pts son Kathlene November left v/m pt complaining with constipation; not sure last time pt had BM. Left v/m for Kathlene November to call back to see if pt has taken any med for constipation.

## 2012-03-16 ENCOUNTER — Encounter: Payer: Self-pay | Admitting: Family Medicine

## 2012-03-16 ENCOUNTER — Ambulatory Visit (INDEPENDENT_AMBULATORY_CARE_PROVIDER_SITE_OTHER): Payer: Medicare Other | Admitting: Family Medicine

## 2012-03-16 VITALS — BP 134/73 | HR 84 | Temp 97.1°F | Ht <= 58 in | Wt 110.8 lb

## 2012-03-16 DIAGNOSIS — R413 Other amnesia: Secondary | ICD-10-CM | POA: Insufficient documentation

## 2012-03-16 DIAGNOSIS — E871 Hypo-osmolality and hyponatremia: Secondary | ICD-10-CM

## 2012-03-16 LAB — BASIC METABOLIC PANEL
Calcium: 9.3 mg/dL (ref 8.4–10.5)
Chloride: 96 mEq/L (ref 96–112)
Creatinine, Ser: 0.8 mg/dL (ref 0.4–1.2)
GFR: 73.46 mL/min (ref >60.00)

## 2012-03-16 NOTE — Progress Notes (Signed)
Subjective:    Patient ID: Brooke [name], female    DOB: 19-Aug-1925, 78 y.o.   MRN: [id number]  HPI Here for f/u -not sleeping well / not feeling good   Aches and pains  Looks and feels better than she did   Is having trouble sleeping - goes to bed late and gets up early  At [facility] - and had a bug problem- now is it improved  Memory is not as good as it used to be (pt denies this , however)- son states he is concerned  One night she called her son and said she did not feel better - had to call someone to stay with her  Thinks it was her "nerves"  No pain / tingling/ numbness Has never had a seizure  Her son indicates that she may have been drinking excessive fluids at the time of the last blood draw Last visit was tx for uti  Lab Results  Component Value Date   TSH 1.30 03/01/2012   this was nl    Chemistry      Component Value Date/Time   NA 131* 03/01/2012 0922   K 4.3 03/01/2012 0922   CL 91* 03/01/2012 0922   CO2 29 03/01/2012 0922   BUN 13 03/01/2012 0922   CREATININE 0.8 03/01/2012 0922      Component Value Date/Time   CALCIUM 10.1 03/01/2012 0922   ALKPHOS 47 03/01/2012 0922   AST 32 03/01/2012 0922   ALT 18 03/01/2012 0922   BILITOT 0.4 03/01/2012 0922     Lab Results  Component Value Date   WBC 4.6 03/01/2012   HGB 12.2 03/01/2012   HCT 36.0 03/01/2012   MCV 93.8 03/01/2012   PLT 274.0 03/01/2012    Na was low at 131- ? Lot of water intake- needs a recheck   Patient Active Problem List  Diagnosis  . HYPERTENSION, UNSPECIFIED  . BRADYCARDIA  . PACEMAKER, PERMANENT  . POSTHERPETIC NEURALGIA  . FUNGAL DERMATITIS  . HYPOTHYROIDISM  . DEPRESSION  . CORONARY ARTERY DISEASE  . ALLERGIC RHINITIS  . GERD  . FOOT PAIN, CHRONIC  . OSTEOPOROSIS  . CEREBROVASCULAR ACCIDENT, HX OF  . Hyperlipidemia  . Dizzy  . Tinea pedis  . UTI (lower urinary tract infection)  . Injection Site Reaction   Past Medical History  Diagnosis Date  . Other specified cardiac dysrhythmias    bradycardia  . Hypertension   . Cardiac pacemaker in situ   . Thyroid disease   . Allergy   . Depression   . GERD (gastroesophageal reflux disease)   . Cerebrovascular accident   . Osteoporosis   . Mastoiditis, chronic   . Eczema   . Hearing loss   . Chronic foot pain   . Melanoma    Past Surgical History  Procedure Date  . Colon surgery    History  Substance Use Topics  . Smoking status: Never Smoker   . Smokeless tobacco: Not on file  . Alcohol Use: No   Family History  Problem Relation Age of Onset  . Alcohol abuse Father   . Heart disease Father   . Alcohol abuse Brother    Allergies  Allergen Reactions  . Penicillins     REACTION: arm swelled  . Pneumococcal Vaccine Polyvalent     REACTION: swelling  . Propoxyphene-Acetaminophen     REACTION: confused pt  . Sulfonamide Derivatives     REACTION: nervousness  . Zantac (Ranitidine Hcl) Diarrhea  Current Outpatient Prescriptions on File Prior to Visit  Medication Sig Dispense Refill  . aspirin 81 MG tablet Take 81 mg by mouth daily.        . calcium carbonate (TUMS) 500 MG chewable tablet Chew 1 tablet by mouth daily.        . clotrimazole (LOTRIMIN) 1 % cream Apply topically as needed.        . docusate sodium (COLACE) 100 MG capsule Take 100 mg by mouth daily.      Marland Kitchen levothyroxine (SYNTHROID, LEVOTHROID) 50 MCG tablet One tablet by mouth daily  90 tablet  3  . loratadine (CLARITIN) 10 MG tablet Take 10 mg by mouth daily.        . meclizine (ANTIVERT) 25 MG tablet Take 0.5 tablets (12.5 mg total) by mouth 2 (two) times daily as needed for dizziness.  30 tablet  1  . metoprolol succinate (TOPROL-XL) 25 MG 24 hr tablet Take 1/2 tablet by mouth once daily at dinner  45 tablet  3  . Multiple Vitamin (MULTIVITAMIN) capsule Take 1 capsule by mouth daily.        Marland Kitchen nystatin (MYCOSTATIN) cream Apply to affected area under breast for rash two times a day as needed          Review of SystemsReview of Systems    Constitutional: Negative for fever, appetite change, fatigue and unexpected weight change.  Eyes: Negative for pain and visual disturbance.  Respiratory: Negative for cough and shortness of breath.   Cardiovascular: Negative for cp or palpitations    Gastrointestinal: Negative for nausea, diarrhea and constipation.  Genitourinary: Negative for urgency and frequency.  Skin: Negative for pallor or rash   Neurological: Negative for weakness, light-headedness, numbness and headaches.  Hematological: Negative for adenopathy. Does not bruise/bleed easily.  Psychiatric/Behavioral: Negative for dysphoric mood. The patient has been more anxious lately       Objective:   Physical Exam  Constitutional: She appears well-developed and well-nourished. No distress.       Frail appearing elderly female, talkative and hard of hearing despite her hearing aide  HENT:  Head: Normocephalic and atraumatic.  Mouth/Throat: Oropharynx is clear and moist.  Eyes: Conjunctivae and EOM are normal. Pupils are equal, round, and reactive to light. No scleral icterus.  Neck: Normal range of motion. Neck supple. No JVD present. Carotid bruit is not present. No thyromegaly present.  Cardiovascular: Normal rate, regular rhythm, normal heart sounds and intact distal pulses.  Exam reveals no gallop.   Pulmonary/Chest: Effort normal and breath sounds normal. No respiratory distress. She has no wheezes.  Abdominal: Soft. Bowel sounds are normal. She exhibits no distension, no abdominal bruit and no mass. There is no tenderness.       No suprapubic tenderness    Musculoskeletal: Normal range of motion. She exhibits no edema and no tenderness.       No cva tenderness   Lymphadenopathy:    She has no cervical adenopathy.  Neurological: She is alert. She has normal reflexes. No cranial nerve deficit. She exhibits normal muscle tone. Coordination normal.  Skin: Skin is warm and dry. No rash noted. No erythema. No pallor.   Psychiatric: Her speech is normal and behavior is normal. Judgment normal. Her mood appears anxious. She is not agitated, not slowed and not withdrawn. She expresses no homicidal and no suicidal ideation. She exhibits abnormal recent memory.       Pt is argumentative with her son about her loss of  memory, and seems moderately anxious today She does confuse easily - but is attentive and talkative and very pleasant           Assessment & Plan:

## 2012-03-16 NOTE — Assessment & Plan Note (Signed)
May have been from too much water intake at one time?- has cut back Re check today No sympt or seizures  Feels better after tx of uti

## 2012-03-16 NOTE — Assessment & Plan Note (Signed)
Pt denies problems but son rec this and also more paranoia and anxiety Thinking about higher level of care - will update  Do not want to potentiate falls with medication at this time

## 2012-03-16 NOTE — Patient Instructions (Addendum)
I'm glad you are feeling better Labs today- then will make a plan regarding sodium level  Keep watching skin  Let me know if memory problems or behavior / mood changes

## 2012-03-29 ENCOUNTER — Emergency Department: Payer: Self-pay | Admitting: Emergency Medicine

## 2012-03-29 LAB — COMPREHENSIVE METABOLIC PANEL
Albumin: 3.8 g/dL (ref 3.4–5.0)
Alkaline Phosphatase: 70 U/L (ref 50–136)
Bilirubin,Total: 0.4 mg/dL (ref 0.2–1.0)
Calcium, Total: 9.1 mg/dL (ref 8.5–10.1)
Co2: 29 mmol/L (ref 21–32)
Creatinine: 0.83 mg/dL (ref 0.60–1.30)
EGFR (Non-African Amer.): >60
Glucose: 109 mg/dL — ABNORMAL HIGH (ref 65–99)
Osmolality: 269 (ref 275–301)
Potassium: 3.8 mmol/L (ref 3.5–5.1)
Sodium: 134 mmol/L — ABNORMAL LOW (ref 136–145)

## 2012-03-29 LAB — CBC
HCT: 33.2 % — ABNORMAL LOW (ref 35.0–47.0)
MCH: 32 pg (ref 26.0–34.0)
MCHC: 34.9 g/dL (ref 32.0–36.0)
MCV: 92 fL (ref 80–100)
Platelet: 287 10*3/uL (ref 150–440)
RDW: 13.6 % (ref 11.5–14.5)
WBC: 6.4 10*3/uL (ref 3.6–11.0)

## 2012-03-29 LAB — URINALYSIS, COMPLETE
Bilirubin,UR: NEGATIVE
Glucose,UR: NEGATIVE mg/dL (ref 0–75)
Ph: 7 (ref 4.5–8.0)
Protein: NEGATIVE
RBC,UR: 3 /HPF (ref 0–5)
Squamous Epithelial: NONE SEEN
WBC UR: 2 /HPF (ref 0–5)

## 2012-04-09 ENCOUNTER — Encounter: Payer: Self-pay | Admitting: Family Medicine

## 2012-04-09 ENCOUNTER — Ambulatory Visit (INDEPENDENT_AMBULATORY_CARE_PROVIDER_SITE_OTHER): Payer: Medicare Other | Admitting: Family Medicine

## 2012-04-09 VITALS — BP 132/71 | HR 77 | Temp 97.8°F | Ht <= 58 in | Wt 110.0 lb

## 2012-04-09 DIAGNOSIS — M810 Age-related osteoporosis without current pathological fracture: Secondary | ICD-10-CM

## 2012-04-09 DIAGNOSIS — Z780 Asymptomatic menopausal state: Secondary | ICD-10-CM | POA: Insufficient documentation

## 2012-04-09 DIAGNOSIS — N951 Menopausal and female climacteric states: Secondary | ICD-10-CM

## 2012-04-09 DIAGNOSIS — R413 Other amnesia: Secondary | ICD-10-CM

## 2012-04-09 NOTE — Assessment & Plan Note (Signed)
Pt has OP with worsening back pain and hx of comp fx and kyphosis  Pt is agreeable to sched dexa  Will f/u in 3 mo

## 2012-04-09 NOTE — Assessment & Plan Note (Signed)
Pt is finally agreeable to sched dexa Rev ca and D - unsure how compliant she is with medication Sent for her ER records [facility] for back pain - wonder about poss new comp fx of TS

## 2012-04-09 NOTE — Progress Notes (Signed)
Subjective:    Patient ID: Brooke [name], female    DOB: Nov 11, 1924, 78 y.o.   MRN: [id number]  HPI Here for f/u of [facility] ER visit - about 2 weeks ago   Had back pain and shortness of breath  xrays -no new findings (of note has hx of  Comp fx) Told her she was having muscle spasms of the back  ua nl -, blood work nl , cxr too  Sent home with tylenol-- that has helped  C/o having trouble urinating - this is chronic   Now is just sore "all over" Worst around her  Sides of ribs and back - she was worried about pleurisy  No cough - does clear throat occasionally  No fever   Hard time getting in bed at night  Is getting around ok  No falls   No memory loss  Mood is sometimes good and sometimes bad  Had separate conversation with her son - he thinks she has some advancing senile dementia - worse with anx and agitation Has not had her deemed incompetent Thinks it is time to start looking for assisted living    Kyphosis- more hunched over lately   Very tangential today  Lives at [facility] in [place]   More agitated Will not allow son to come in room with her but I have permission to speak to him   Patient Active Problem List  Diagnosis  . HYPERTENSION, UNSPECIFIED  . BRADYCARDIA  . PACEMAKER, PERMANENT  . POSTHERPETIC NEURALGIA  . FUNGAL DERMATITIS  . HYPOTHYROIDISM  . DEPRESSION  . CORONARY ARTERY DISEASE  . ALLERGIC RHINITIS  . GERD  . FOOT PAIN, CHRONIC  . OSTEOPOROSIS  . CEREBROVASCULAR ACCIDENT, HX OF  . Hyperlipidemia  . Dizzy  . Tinea pedis  . UTI (lower urinary tract infection)  . Injection Site Reaction  . Hyponatremia  . Memory loss   Past Medical History  Diagnosis Date  . Other specified cardiac dysrhythmias     bradycardia  . Hypertension   . Cardiac pacemaker in situ   . Thyroid disease   . Allergy   . Depression   . GERD (gastroesophageal reflux disease)   . Cerebrovascular accident   . Osteoporosis   . Mastoiditis, chronic   .  Eczema   . Hearing loss   . Chronic foot pain   . Melanoma    Past Surgical History  Procedure Date  . Colon surgery    History  Substance Use Topics  . Smoking status: Never Smoker   . Smokeless tobacco: Not on file  . Alcohol Use: No   Family History  Problem Relation Age of Onset  . Alcohol abuse Father   . Heart disease Father   . Alcohol abuse Brother    Allergies  Allergen Reactions  . Penicillins     REACTION: arm swelled  . Pneumococcal Vaccine Polyvalent     REACTION: swelling  . Propoxyphene-Acetaminophen     REACTION: confused pt  . Sulfonamide Derivatives     REACTION: nervousness  . Zantac (Ranitidine Hcl) Diarrhea   Current Outpatient Prescriptions on File Prior to Visit  Medication Sig Dispense Refill  . aspirin 81 MG tablet Take 81 mg by mouth daily.        . calcium carbonate (TUMS) 500 MG chewable tablet Chew 1 tablet by mouth daily.        . clotrimazole (LOTRIMIN) 1 % cream Apply topically as needed.        Marland Kitchen  docusate sodium (COLACE) 100 MG capsule Take 100 mg by mouth daily.      Marland Kitchen levothyroxine (SYNTHROID, LEVOTHROID) 50 MCG tablet One tablet by mouth daily  90 tablet  3  . loratadine (CLARITIN) 10 MG tablet Take 10 mg by mouth daily.        . meclizine (ANTIVERT) 25 MG tablet Take 0.5 tablets (12.5 mg total) by mouth 2 (two) times daily as needed for dizziness.  30 tablet  1  . metoprolol succinate (TOPROL-XL) 25 MG 24 hr tablet Take 1/2 tablet by mouth once daily at dinner  45 tablet  3  . Multiple Vitamin (MULTIVITAMIN) capsule Take 1 capsule by mouth daily.        Marland Kitchen nystatin (MYCOSTATIN) cream Apply to affected area under breast for rash two times a day as needed             Review of Systems Review of Systems  Constitutional: Negative for fever, appetite change, fatigue and unexpected weight change.  Eyes: Negative for pain and visual disturbance.  Respiratory: Negative for cough and shortness of breath.   Cardiovascular: Negative for  cp or palpitations    Gastrointestinal: Negative for nausea, diarrhea and constipation.  Genitourinary: Negative for urgency and frequency. (incontinence at times - not often per pt ) Skin: Negative for pallor or rash   MSk pos for thoracolumbar back pain  Neurological: Negative for weakness, light-headedness, numbness and headaches. neg for ataxia Hematological: Negative for adenopathy. Does not bruise/bleed easily.  Psychiatric/Behavioral: Negative for dysphoric mood. The patient is not nervous/anxious.         Objective:   Physical Exam  Constitutional: She appears well-developed and well-nourished. No distress.       Very frail appearing elderly female with kyphosis  HENT:  Head: Normocephalic and atraumatic.  Mouth/Throat: Oropharynx is clear and moist.  Eyes: Conjunctivae and EOM are normal. Pupils are equal, round, and reactive to light. No scleral icterus.  Neck: Normal range of motion. Neck supple. No JVD present. Carotid bruit is not present. No thyromegaly present.  Cardiovascular: Normal rate, regular rhythm and normal heart sounds.   Pulmonary/Chest: Effort normal and breath sounds normal. No respiratory distress. She has no wheezes.  Abdominal: Soft. Bowel sounds are normal. She exhibits no distension and no abdominal bruit. There is no tenderness.  Musculoskeletal: She exhibits tenderness. She exhibits no edema.       Marked kyphosis Some mild tenderness of TS  Lymphadenopathy:    She has no cervical adenopathy.  Neurological: She is alert. She has normal reflexes. No cranial nerve deficit. She exhibits normal muscle tone. Coordination normal.  Skin: Skin is warm and dry. No rash noted. No erythema. No pallor.  Psychiatric: Her speech is normal. Her mood appears anxious. Her affect is labile. She is agitated. She is not withdrawn and not actively hallucinating. Thought content is paranoid. She expresses no homicidal and no suicidal ideation. She exhibits abnormal recent  memory.       More agitated at times than usual- some paranoia as well  Talkative  Very tangential          Assessment & Plan:

## 2012-04-09 NOTE — Assessment & Plan Note (Addendum)
Further increased paranoia and anxiety - she denies but is evident and talked to her son about it  I do feel she would benefit from assisted living for help with safety and meds  He is working on investigating options - she is not yet agreeable, however  He eventually may have to have her deemed incompetent in order to tx her dementia or change her living situation

## 2012-04-09 NOTE — Patient Instructions (Addendum)
We will schedule bone density test at check out  Tylenol is ok - take no more than 2 pills every 4 hours  Warm compress to spine or ribs is ok  Follow up in 3 months

## 2012-04-27 LAB — COMPREHENSIVE METABOLIC PANEL
Albumin: 3.8 g/dL (ref 3.4–5.0)
Anion Gap: 5 — ABNORMAL LOW (ref 7–16)
BUN: 19 mg/dL — ABNORMAL HIGH (ref 7–18)
Bilirubin,Total: 0.4 mg/dL (ref 0.2–1.0)
Chloride: 97 mmol/L — ABNORMAL LOW (ref 98–107)
Creatinine: 0.94 mg/dL (ref 0.60–1.30)
EGFR (African American): >60
Glucose: 104 mg/dL — ABNORMAL HIGH (ref 65–99)
Osmolality: 269 (ref 275–301)
Potassium: 4 mmol/L (ref 3.5–5.1)
SGOT(AST): 40 U/L — ABNORMAL HIGH (ref 15–37)
SGPT (ALT): 20 U/L (ref 12–78)
Sodium: 133 mmol/L — ABNORMAL LOW (ref 136–145)
Total Protein: 8.3 g/dL — ABNORMAL HIGH (ref 6.4–8.2)

## 2012-04-27 LAB — URINALYSIS, COMPLETE
Bilirubin,UR: NEGATIVE
Hyaline Cast: 4
Nitrite: NEGATIVE
Ph: 5 (ref 4.5–8.0)
Protein: NEGATIVE
RBC,UR: 5 /HPF (ref 0–5)
Specific Gravity: 1.021 (ref 1.003–1.030)
Squamous Epithelial: 1

## 2012-04-27 LAB — TROPONIN I: Troponin-I: <0.02 ng/mL

## 2012-04-27 LAB — CBC
HCT: 34.7 % — ABNORMAL LOW (ref 35.0–47.0)
MCHC: 33.9 g/dL (ref 32.0–36.0)
RDW: 14.2 % (ref 11.5–14.5)

## 2012-04-27 LAB — CK TOTAL AND CKMB (NOT AT ARMC)
CK, Total: 179 U/L (ref 21–215)
CK-MB: 11.3 ng/mL — ABNORMAL HIGH (ref 0.5–3.6)

## 2012-04-27 LAB — LIPASE, BLOOD: Lipase: 458 U/L — ABNORMAL HIGH (ref 73–393)

## 2012-04-28 ENCOUNTER — Observation Stay: Payer: Self-pay | Admitting: Internal Medicine

## 2012-04-28 LAB — LIPASE, BLOOD: Lipase: 138 U/L (ref 73–393)

## 2012-04-29 LAB — CBC WITH DIFFERENTIAL/PLATELET
Basophil #: 0 10*3/uL (ref 0.0–0.1)
Basophil %: 0.6 %
Eosinophil #: 0.1 10*3/uL (ref 0.0–0.7)
HCT: 31 % — ABNORMAL LOW (ref 35.0–47.0)
Lymphocyte #: 1.2 10*3/uL (ref 1.0–3.6)
MCH: 33.1 pg (ref 26.0–34.0)
MCHC: 35.6 g/dL (ref 32.0–36.0)
MCV: 93 fL (ref 80–100)
Monocyte #: 0.6 x10 3/mm (ref 0.2–0.9)
Monocyte %: 11.3 %
Neutrophil #: 3.2 10*3/uL (ref 1.4–6.5)
Platelet: 281 10*3/uL (ref 150–440)
RDW: 14 % (ref 11.5–14.5)
WBC: 5.1 10*3/uL (ref 3.6–11.0)

## 2012-04-29 LAB — URINALYSIS, COMPLETE
Leukocyte Esterase: NEGATIVE
Nitrite: NEGATIVE
Protein: NEGATIVE
RBC,UR: 5 /HPF (ref 0–5)
Squamous Epithelial: 2
WBC UR: 1 /HPF (ref 0–5)

## 2012-04-29 LAB — COMPREHENSIVE METABOLIC PANEL
Albumin: 3 g/dL — ABNORMAL LOW (ref 3.4–5.0)
Alkaline Phosphatase: 80 U/L (ref 50–136)
Anion Gap: 8 (ref 7–16)
BUN: 9 mg/dL (ref 7–18)
Bilirubin,Total: 0.5 mg/dL (ref 0.2–1.0)
Calcium, Total: 8.5 mg/dL (ref 8.5–10.1)
Chloride: 99 mmol/L (ref 98–107)
Co2: 28 mmol/L (ref 21–32)
Creatinine: 0.65 mg/dL (ref 0.60–1.30)
EGFR (African American): >60
Glucose: 104 mg/dL — ABNORMAL HIGH (ref 65–99)
Osmolality: 269 (ref 275–301)
Potassium: 3.1 mmol/L — ABNORMAL LOW (ref 3.5–5.1)
Sodium: 135 mmol/L — ABNORMAL LOW (ref 136–145)
Total Protein: 6.7 g/dL (ref 6.4–8.2)

## 2012-04-29 LAB — MAGNESIUM: Magnesium: 1.5 mg/dL — ABNORMAL LOW

## 2012-04-29 LAB — URINE CULTURE

## 2012-04-30 ENCOUNTER — Encounter: Payer: Self-pay | Admitting: Internal Medicine

## 2012-05-01 LAB — HEMOGLOBIN: HGB: 11.3 g/dL — ABNORMAL LOW (ref 12.0–16.0)

## 2012-05-01 LAB — SODIUM: Sodium: 130 mmol/L — ABNORMAL LOW (ref 136–145)

## 2012-05-01 LAB — MAGNESIUM: Magnesium: 1.6 mg/dL — ABNORMAL LOW

## 2012-05-02 ENCOUNTER — Encounter: Payer: Self-pay | Admitting: Internal Medicine

## 2012-05-04 LAB — TSH: Thyroid Stimulating Horm: 6.15 u[IU]/mL — ABNORMAL HIGH

## 2012-05-05 LAB — URINALYSIS, COMPLETE
Bacteria: NONE SEEN
Blood: NEGATIVE
Glucose,UR: NEGATIVE mg/dL (ref 0–75)
Leukocyte Esterase: NEGATIVE
Nitrite: NEGATIVE
Protein: NEGATIVE
Specific Gravity: 1.012 (ref 1.003–1.030)
WBC UR: <1 /HPF (ref 0–5)

## 2012-05-06 LAB — URINE CULTURE

## 2012-05-15 LAB — TSH: Thyroid Stimulating Horm: 9.33 u[IU]/mL — ABNORMAL HIGH

## 2012-05-15 LAB — FOLATE: Folic Acid: 20.2 ng/mL (ref 3.1–100.0)

## 2012-05-18 LAB — TSH: Thyroid Stimulating Horm: 10.6 u[IU]/mL — ABNORMAL HIGH

## 2012-05-20 LAB — HEPATIC FUNCTION PANEL A (ARMC)
Alkaline Phosphatase: 119 U/L (ref 50–136)
Bilirubin, Direct: 0.1 mg/dL (ref 0.00–0.20)
Bilirubin,Total: 0.4 mg/dL (ref 0.2–1.0)
SGPT (ALT): 15 U/L (ref 12–78)

## 2012-05-20 LAB — CBC WITH DIFFERENTIAL/PLATELET
Basophil #: 0 10*3/uL (ref 0.0–0.1)
Eosinophil #: 0 10*3/uL (ref 0.0–0.7)
HCT: 33.9 % — ABNORMAL LOW (ref 35.0–47.0)
Lymphocyte #: 1.3 10*3/uL (ref 1.0–3.6)
MCHC: 34 g/dL (ref 32.0–36.0)
MCV: 94 fL (ref 80–100)
Monocyte #: 0.5 x10 3/mm (ref 0.2–0.9)
Monocyte %: 7.7 %
Neutrophil #: 4.9 10*3/uL (ref 1.4–6.5)
Neutrophil %: 71.9 %
Platelet: 373 10*3/uL (ref 150–440)
RDW: 13.6 % (ref 11.5–14.5)
WBC: 6.8 10*3/uL (ref 3.6–11.0)

## 2012-05-20 LAB — BASIC METABOLIC PANEL
Anion Gap: 8 (ref 7–16)
Co2: 30 mmol/L (ref 21–32)
Creatinine: 1.02 mg/dL (ref 0.60–1.30)
EGFR (African American): 58 — ABNORMAL LOW
Glucose: 84 mg/dL (ref 65–99)
Osmolality: 297 (ref 275–301)
Sodium: 142 mmol/L (ref 136–145)

## 2012-05-20 LAB — AMYLASE: Amylase: 45 U/L (ref 25–115)

## 2012-06-01 ENCOUNTER — Encounter: Payer: Self-pay | Admitting: Internal Medicine

## 2012-06-01 LAB — TSH: Thyroid Stimulating Horm: 3.85 u[IU]/mL

## 2012-06-02 ENCOUNTER — Telehealth: Payer: Self-pay

## 2012-06-02 DIAGNOSIS — F0390 Unspecified dementia without behavioral disturbance: Secondary | ICD-10-CM

## 2012-06-02 NOTE — Telephone Encounter (Signed)
Pt's son called pt has been at [facility] for 3 weeks following T7 fx. Kathlene November had FL2 filled out on pt while at [facility]; pt may have to go to skilled or more advanced level of care. Pts son wanted to know if dementia was listed as diagnosis on pts problem list; advised memory loss listed but did not see dementia. Kathlene November said pts mental state has significantly declined since pt has been at [facility]. Kathlene November said if he needs anything else he will call.

## 2012-06-02 NOTE — Telephone Encounter (Signed)
I have no doubt at this point she does have dementia- would it help him to add that to her problem list? Let me know  Thanks for the update

## 2012-06-18 ENCOUNTER — Telehealth: Payer: Self-pay

## 2012-06-18 NOTE — Telephone Encounter (Signed)
Kathlene November notified and he has started the process of getting her into [place] [place] faxed a med. History form for you to fill out and he said he will get a new FL2 form to [place]

## 2012-06-18 NOTE — Telephone Encounter (Signed)
She would need dementia on FL-2 but this seems appropriate looking at your note  Spoke with Scientist, forensic at [facility] memory care---no beds for AL or skilled at [facility] memory care Could probably get her into skilled at [facility] (regular building)  if she needs that and could go on waiting list for memory care but certainly no guarantees

## 2012-06-18 NOTE — Telephone Encounter (Signed)
Pt's son, Felesia Zunino called pt is in skilled nursing facility; did pt have diagnosis of dementia under Dr Milinda Antis; Kathlene November needs dementia dx on letterhead.Please advise.

## 2012-06-18 NOTE — Telephone Encounter (Signed)
Pt was at [facility] for rehab then transferred to [facility]. Brooke [name] not happy with care at [facility] and is going to transfer pt to another facililty. Pt is not eligible for memory care unit due to no diagnosis of dementia. Pt is not orientated to time date or place. Pt does know Brooke [name]. Brooke [name] needs documentation that pt has dementia diagnosis. Brooke [name] would like to get pt into [facility]; he understands Dr Dayton Martes goes to [facility] to see pts and wants to know if Dr Milinda Antis can help with admission to [facility]. Brooke [name] is on route to [facility] now to begin admission process.Please advise.

## 2012-06-18 NOTE — Telephone Encounter (Signed)
I will route to Rich as he may be able to give more advise about admission process.

## 2012-06-18 NOTE — Telephone Encounter (Signed)
Dr. Dayton Martes please see prev. note regarding you seeing new pt at [facility]

## 2012-06-18 NOTE — Telephone Encounter (Signed)
She DOES have a dx of dementia and it is in the chart  Have him get the [facility] for me to fill out  Please send Dr Dayton Martes a flag and see if she is taking new pt's at [facility], thanks

## 2012-06-18 NOTE — Telephone Encounter (Signed)
Kathlene November notified and advise to get FL2 form

## 2012-06-18 NOTE — Telephone Encounter (Signed)
Let Kathlene November know that - it looks like putting her in skilled nursing (then can be on waiting list for memory care)- would be an option - again- I will fill out an FL2 when they have one for me  I'm sure when he went over there - he learned some of this as well

## 2012-06-21 DIAGNOSIS — Z0279 Encounter for issue of other medical certificate: Secondary | ICD-10-CM

## 2012-06-22 NOTE — Telephone Encounter (Signed)
I will wait on those papers Also she was ref to [facility]

## 2012-09-03 ENCOUNTER — Ambulatory Visit: Payer: Medicare Other | Admitting: Family Medicine

## 2012-10-30 DEATH — deceased

## 2013-05-31 IMAGING — CR DG CHEST 2V
1 series · 2 of 2 positions shown · non-contrast
Comparison: none

REASON FOR EXAM: shortness of breath
COMMENTS:

[Series 1: x chest ap · 0.14mm/px · 2 of 2 slices shown]
[im 1/2]
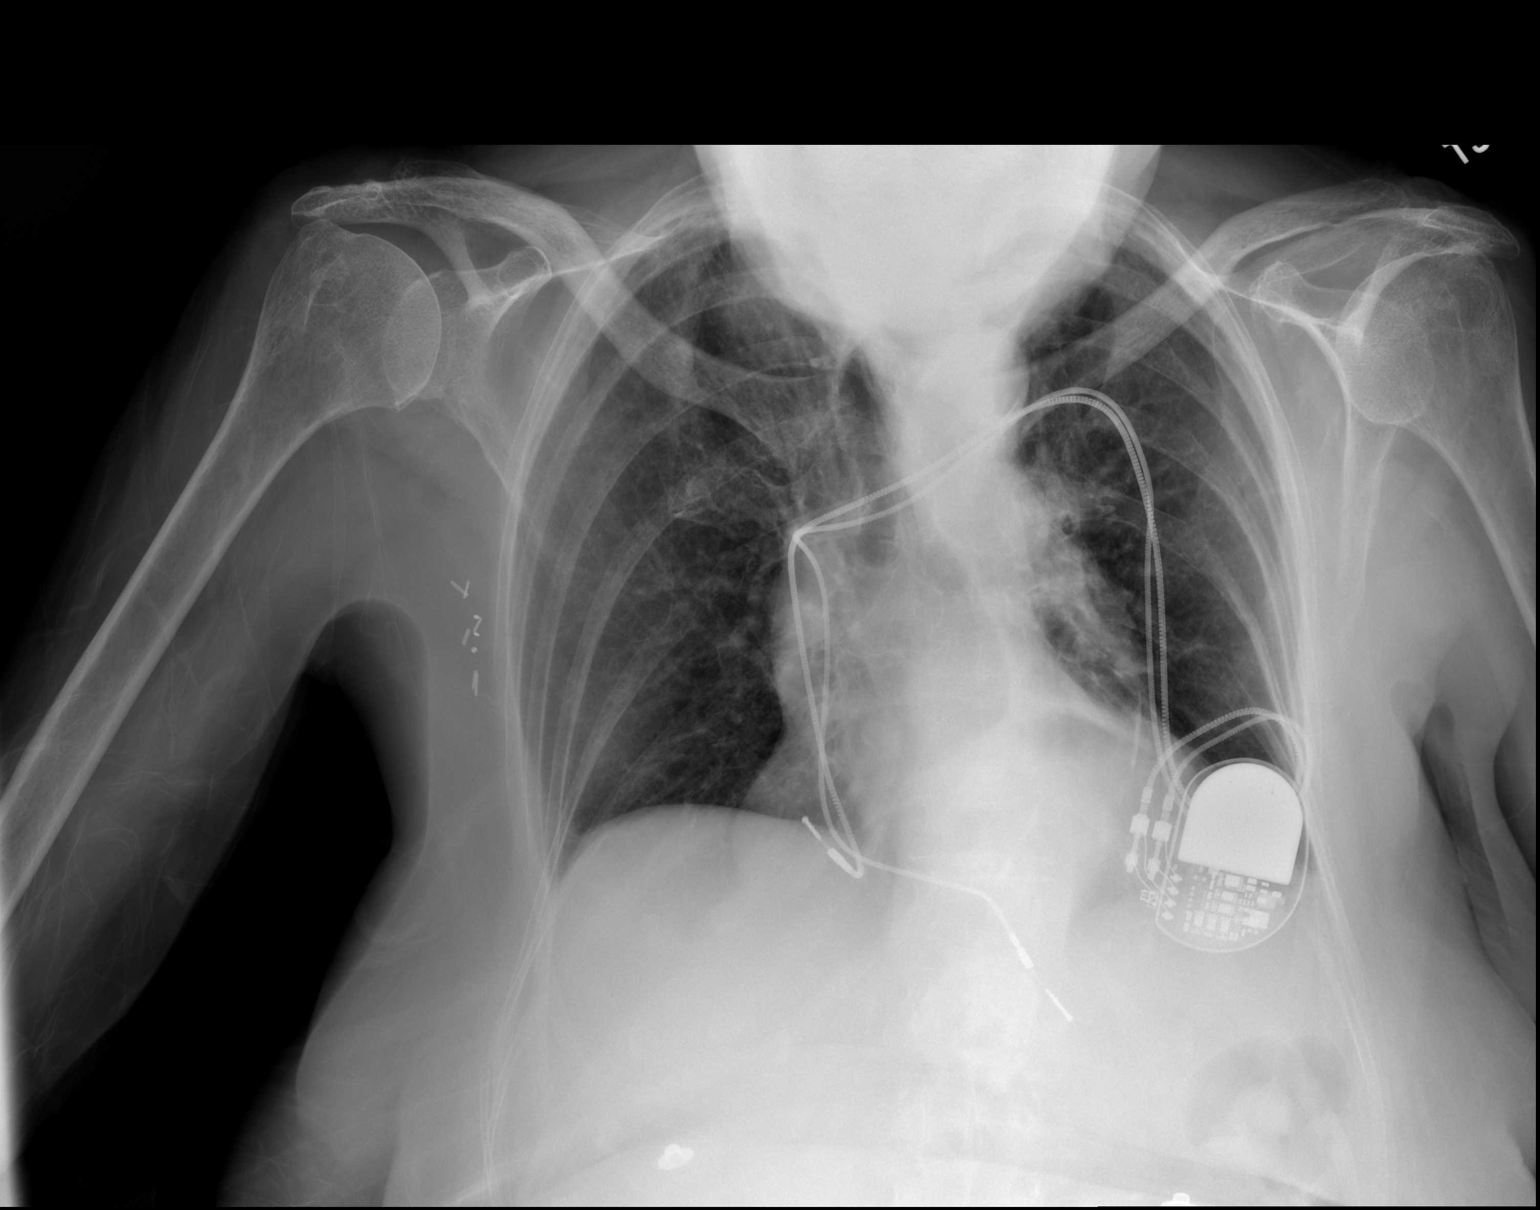
[im 2/2]
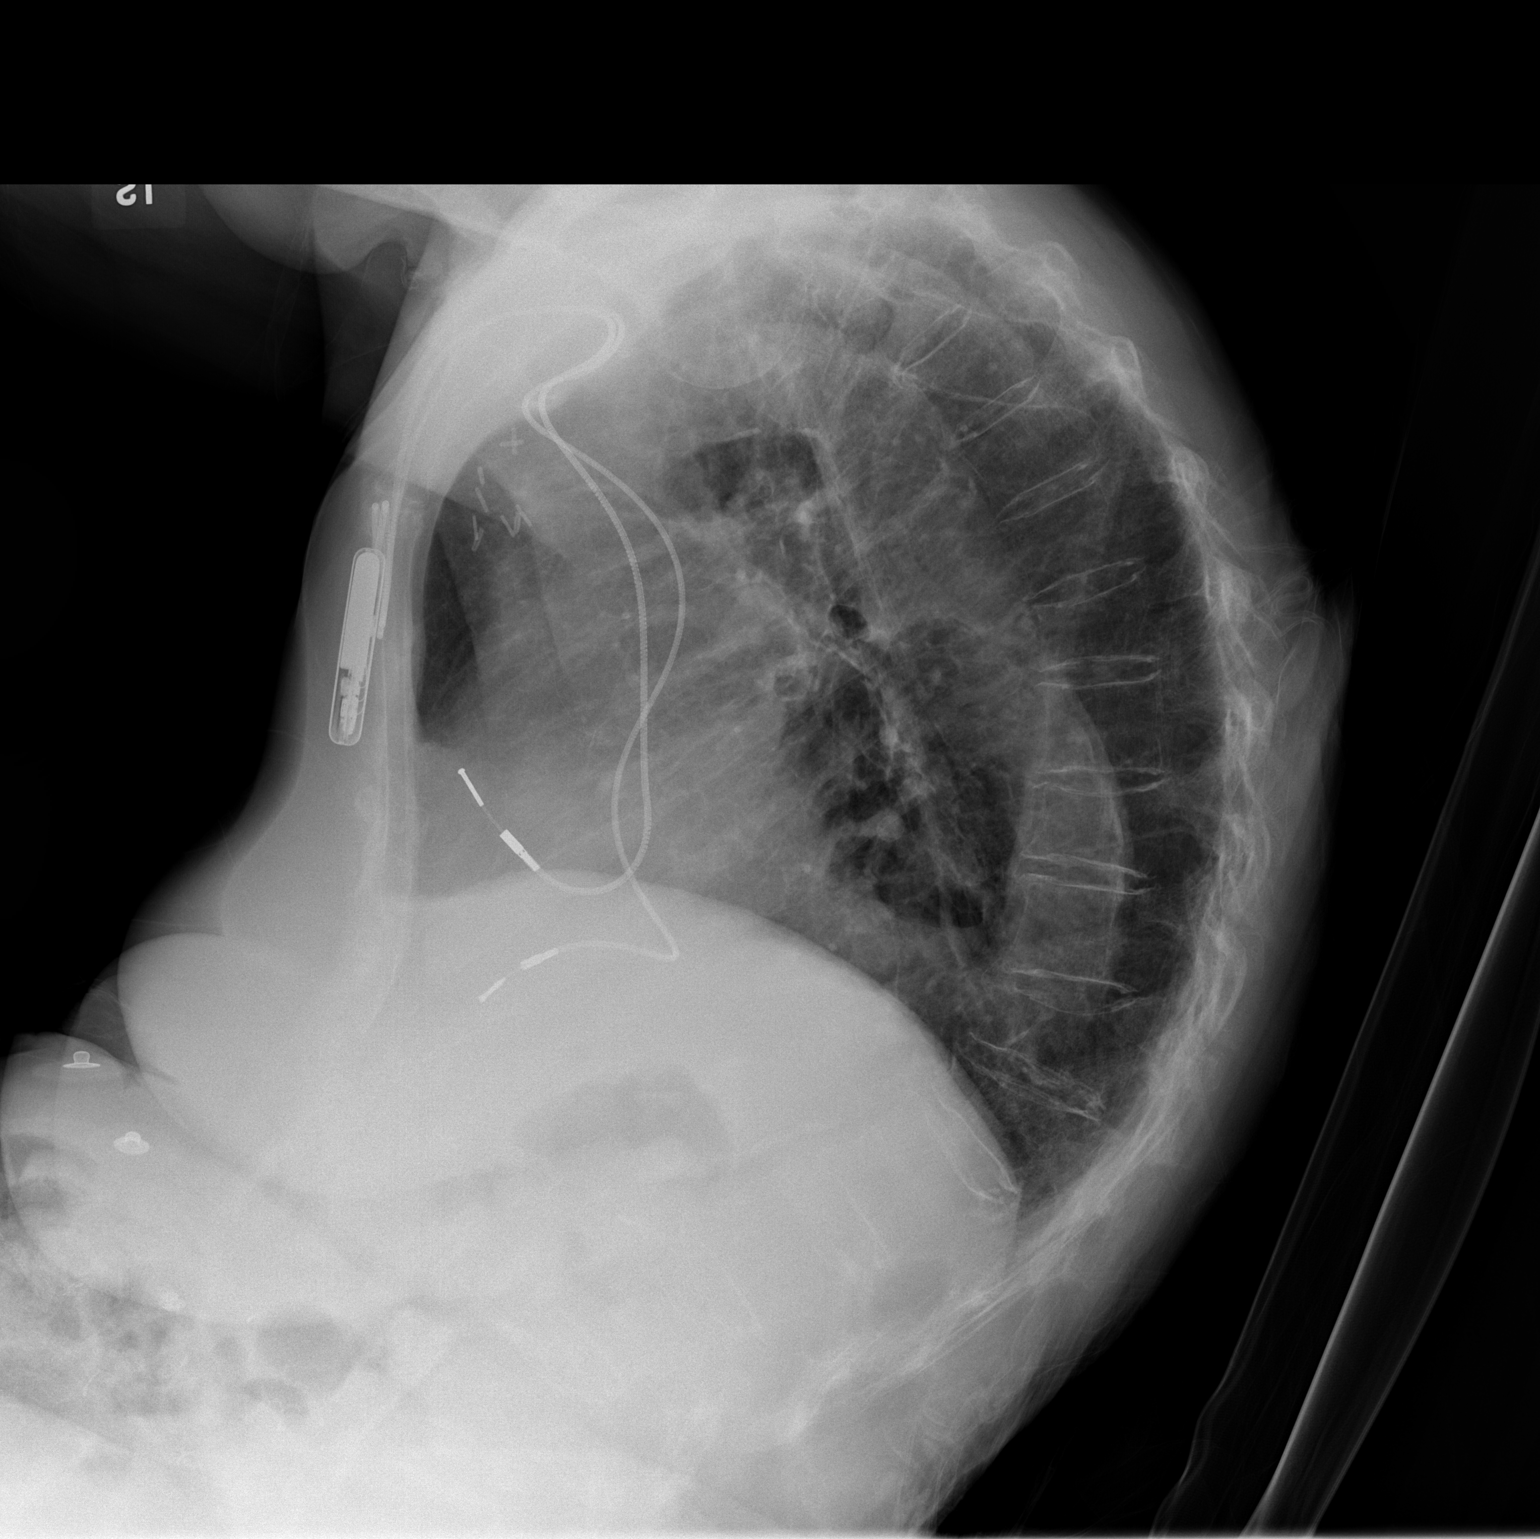

[2 of 2 positions shown; findings below may reference images not displayed]

PROCEDURE:     DXR - DXR CHEST PA (OR AP) AND LATERAL  - March 29, 2012 [DATE]

RESULT:     Lungs clear. Cardiac structures unremarkable. Sliding hiatal
hernia present. Cardiac pacer lead tips in right atrium and right ventricle.
Surgical clips right axilla. Lungs clear. Sliding hiatal hernia noted.
Surgical clips are  right axilla. Degenerative changes and osteopenia noted
of the thoracic spine with mild lower thoracic spine compression fracture.
IMPRESSION: No acute or chronic disease. Cardiac pacer. Sliding hiatal
hernia. Mild compression fracture lower thoracic spine

## 2013-06-29 IMAGING — CR DG ABDOMEN 3V
1 series · 4 of 4 positions shown · non-contrast
Comparison: none

REASON FOR EXAM: ABD DISTENTION AND PAIN
COMMENTS:

PROCEDURE:     DXR - DXR ABDOMEN 3-WAY (INCL PA CXR)  - April 27, 2012  [DATE]
RESULT:     Comparison: Chest radiograph 03/29/2012

[Series 1: x chest ap · 0.14mm/px · 4 of 4 slices shown]
[im 1/4]
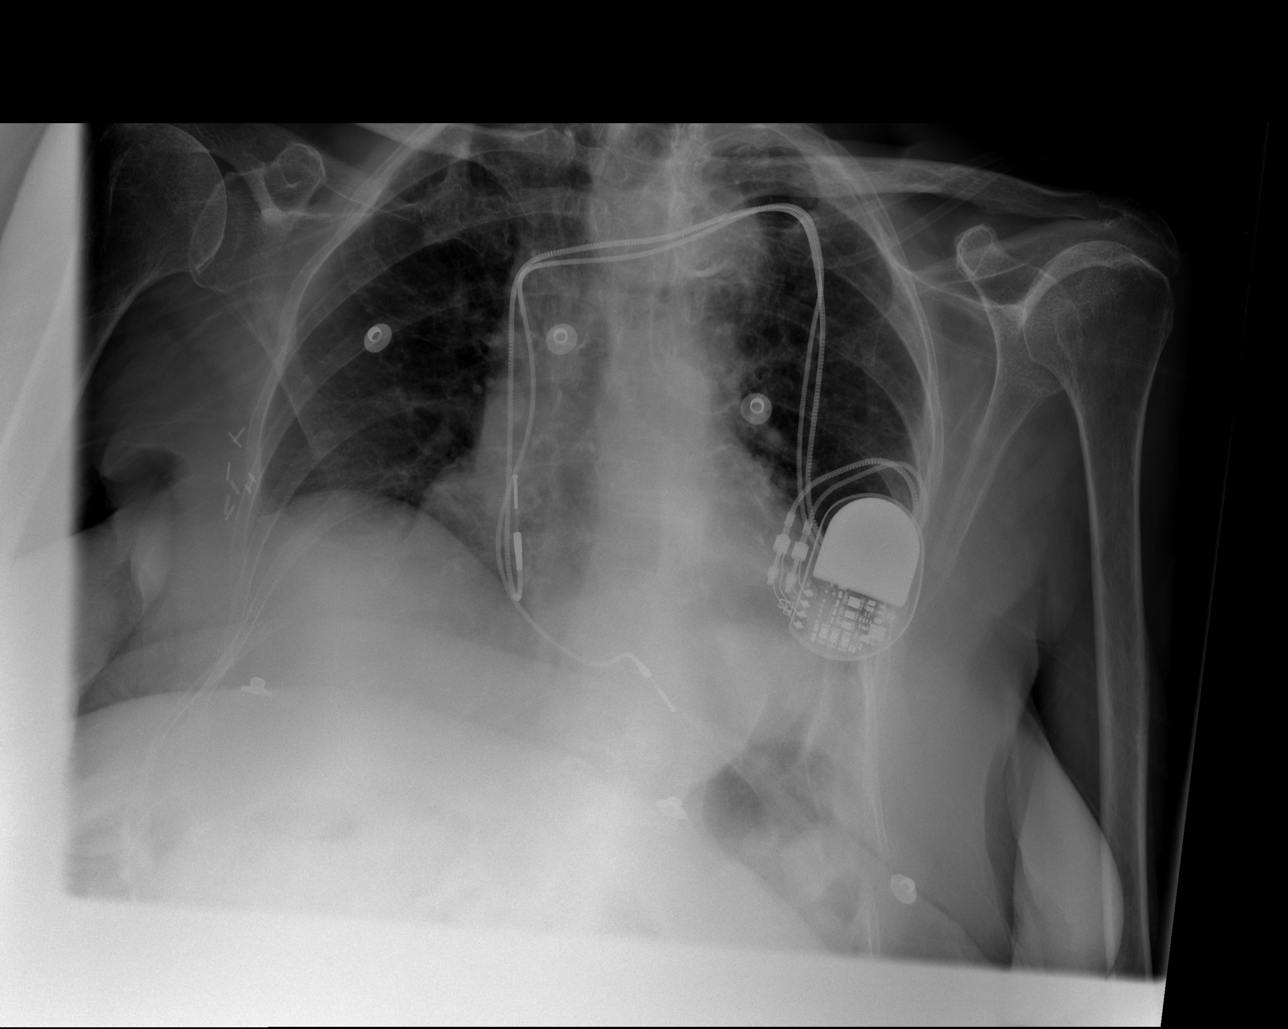
[im 2/4]
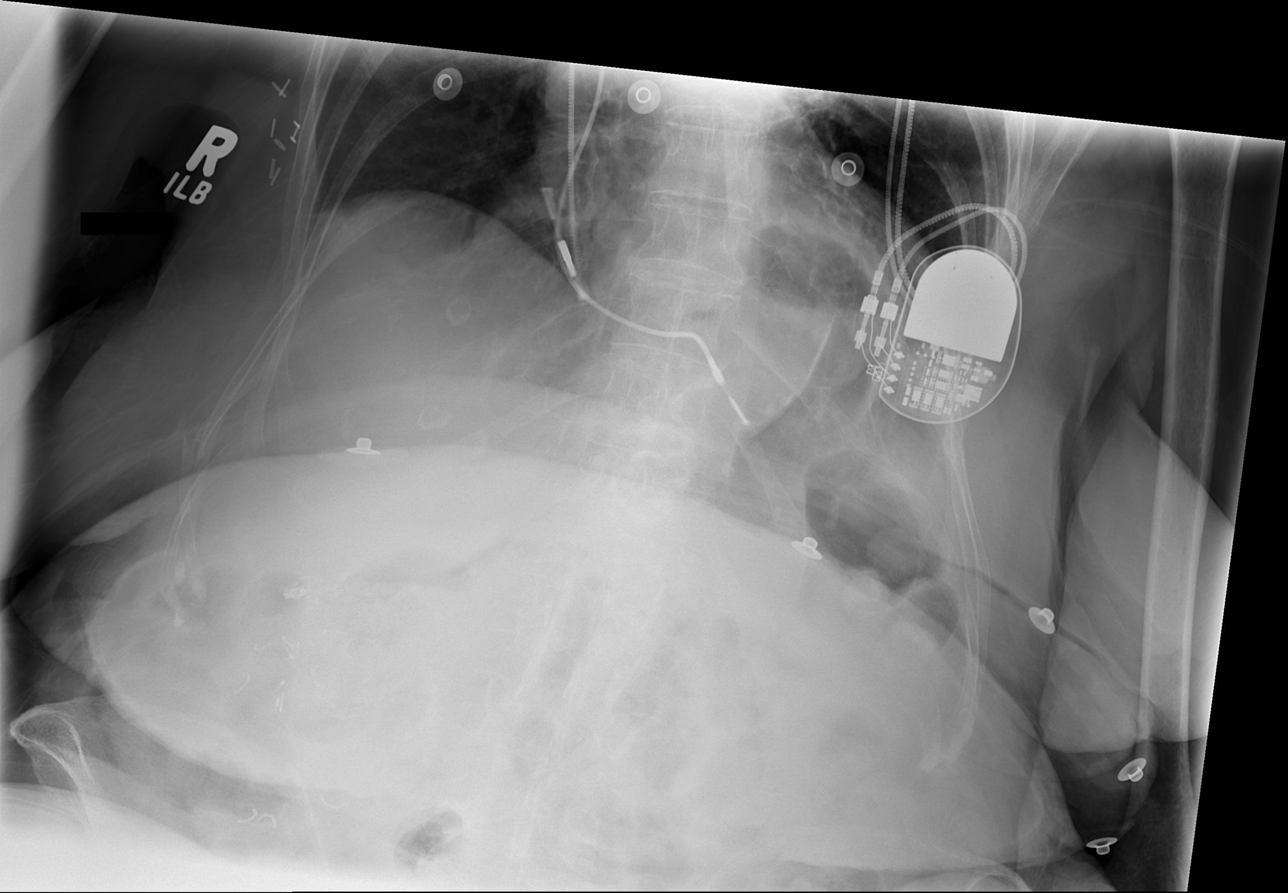
[im 3/4]
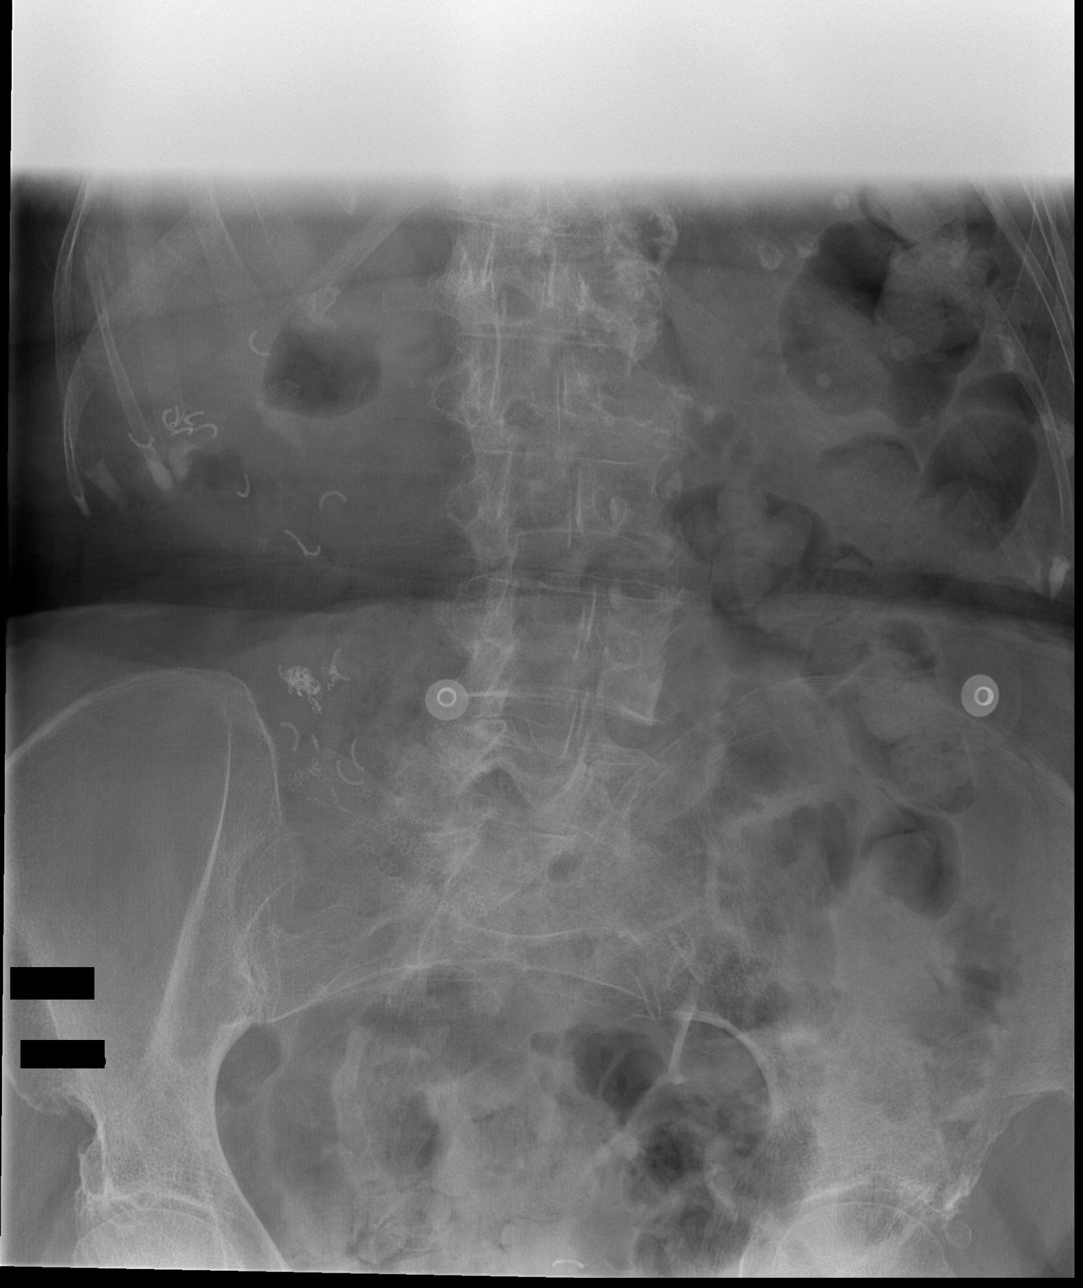
[im 4/4]
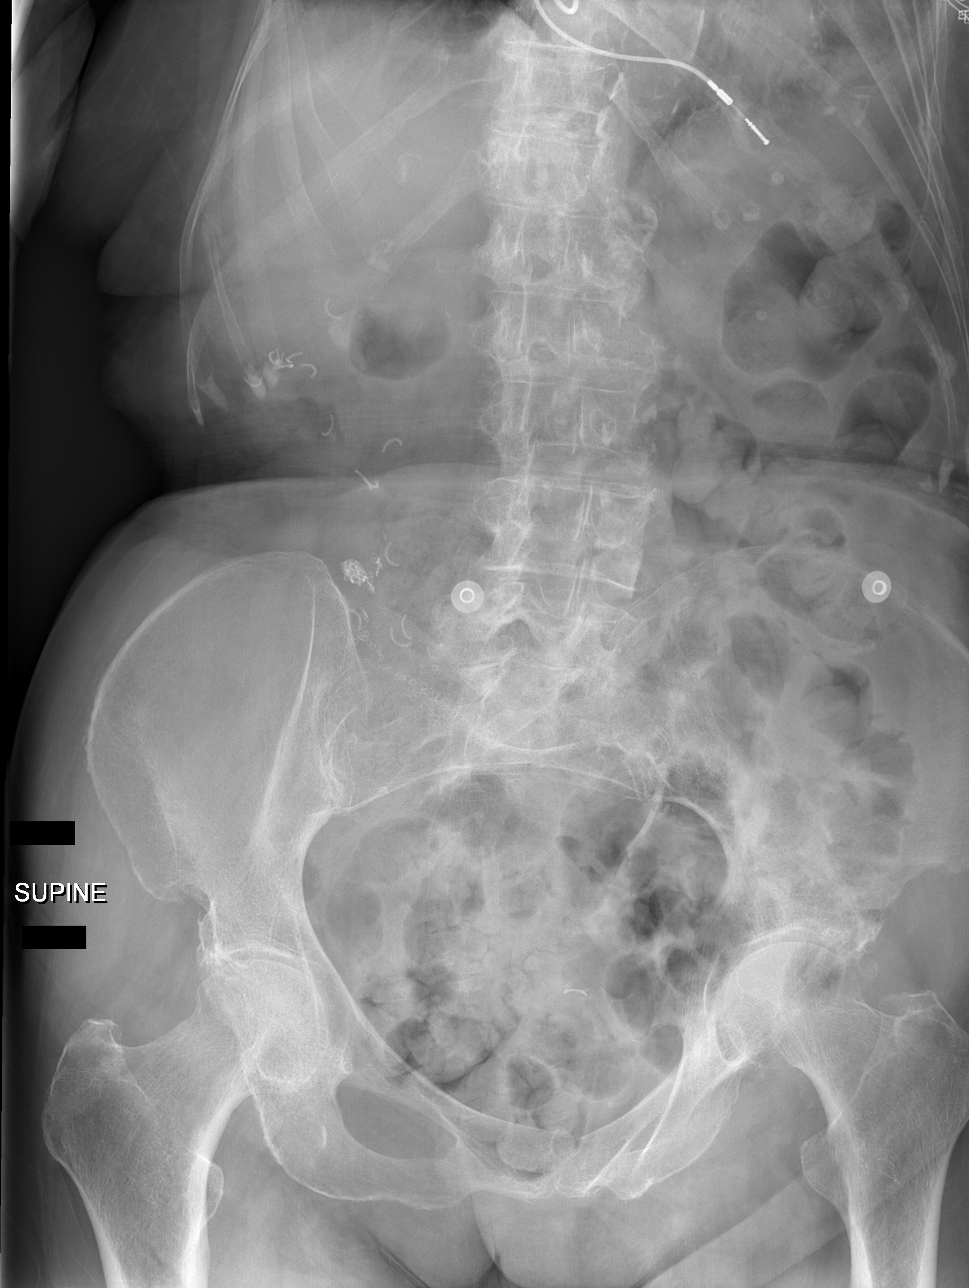

[4 of 4 positions shown; findings below may reference images not displayed]

FINDINGS: The heart and mediastinum are stable, given patient positioning. Mild
basilar opacities are likely secondary to atelectasis. There is a
moderate-sized hiatal hernia. No definite free intraperitoneal air. Air is
seen within nondilated small and large bowel. Multiple surgical clips
overlie the right hemiabdomen.
IMPRESSION: Nonobstructed bowel gas pattern.

[REDACTED]

## 2013-07-02 IMAGING — CR DG ABDOMEN 1V
1 series · 2 of 2 positions shown · non-contrast
Comparison: none

REASON FOR EXAM: ileus?, impaction? constiaption?, no sgtool foR WHILE
COMMENTS:

[Series 10: x abdomen supine · 0.14mm/px · 2 of 2 slices shown]
[im 1/2]
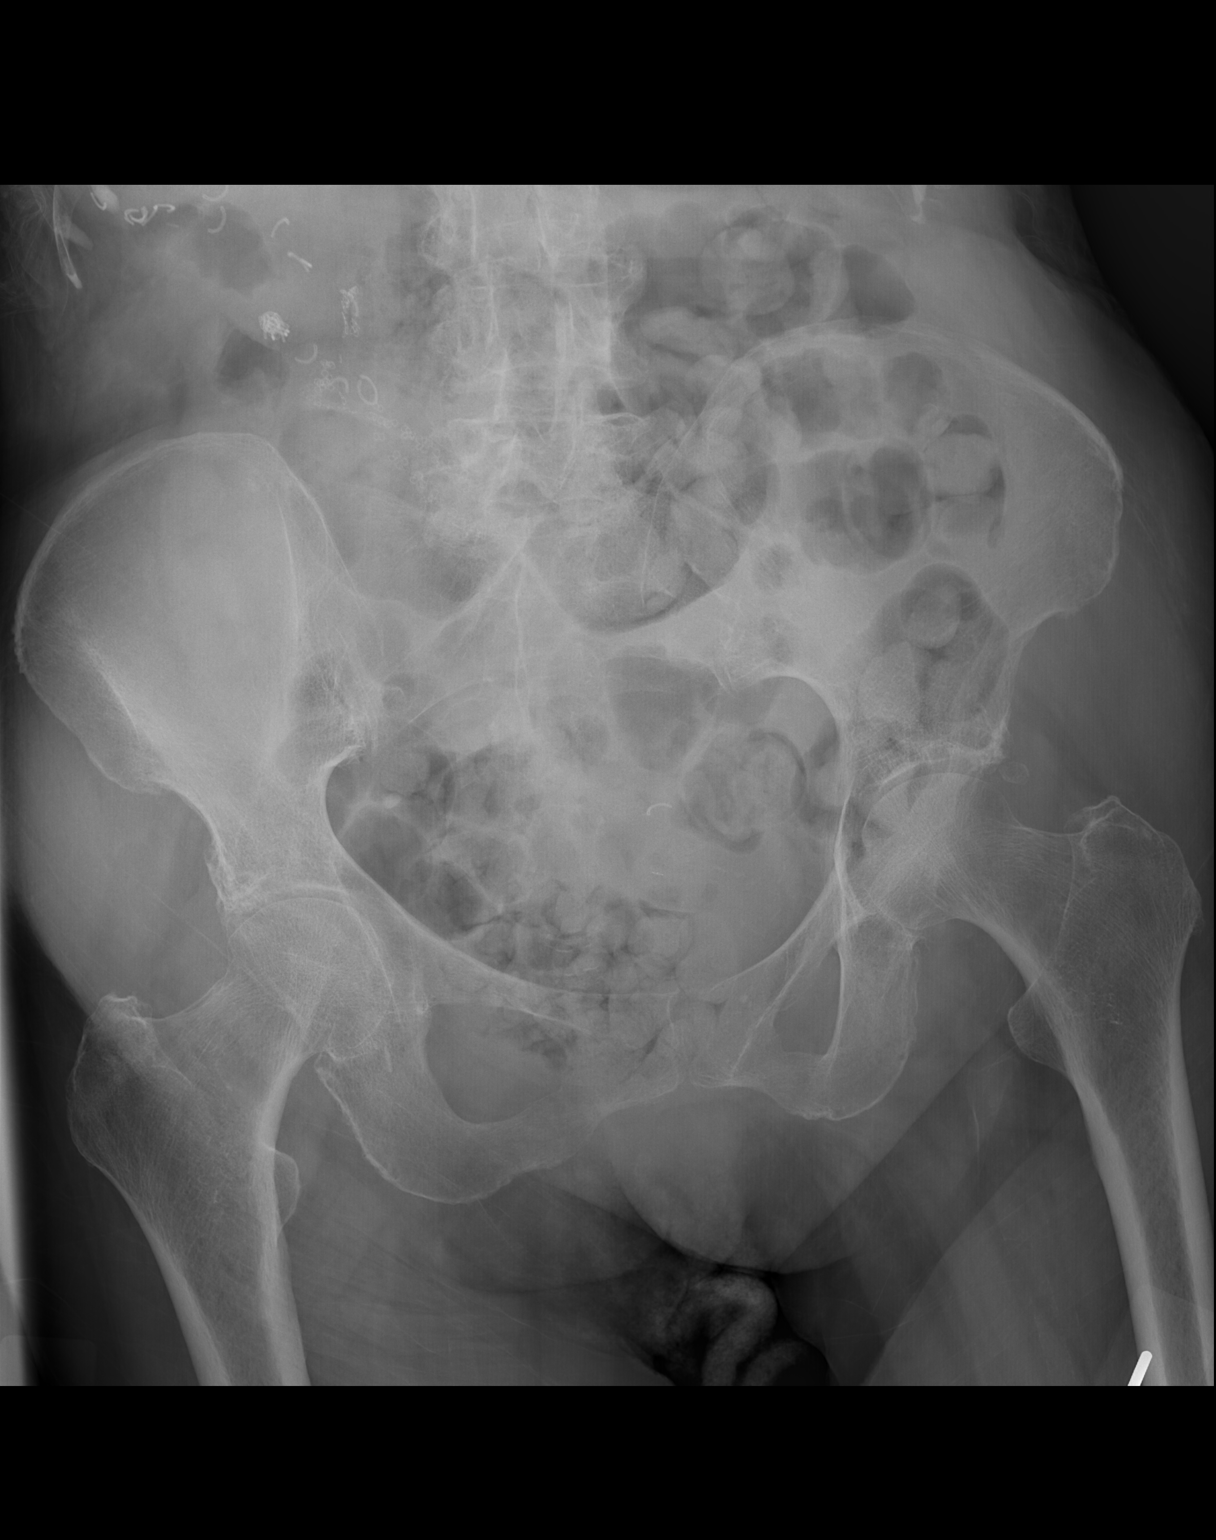
[im 2/2]
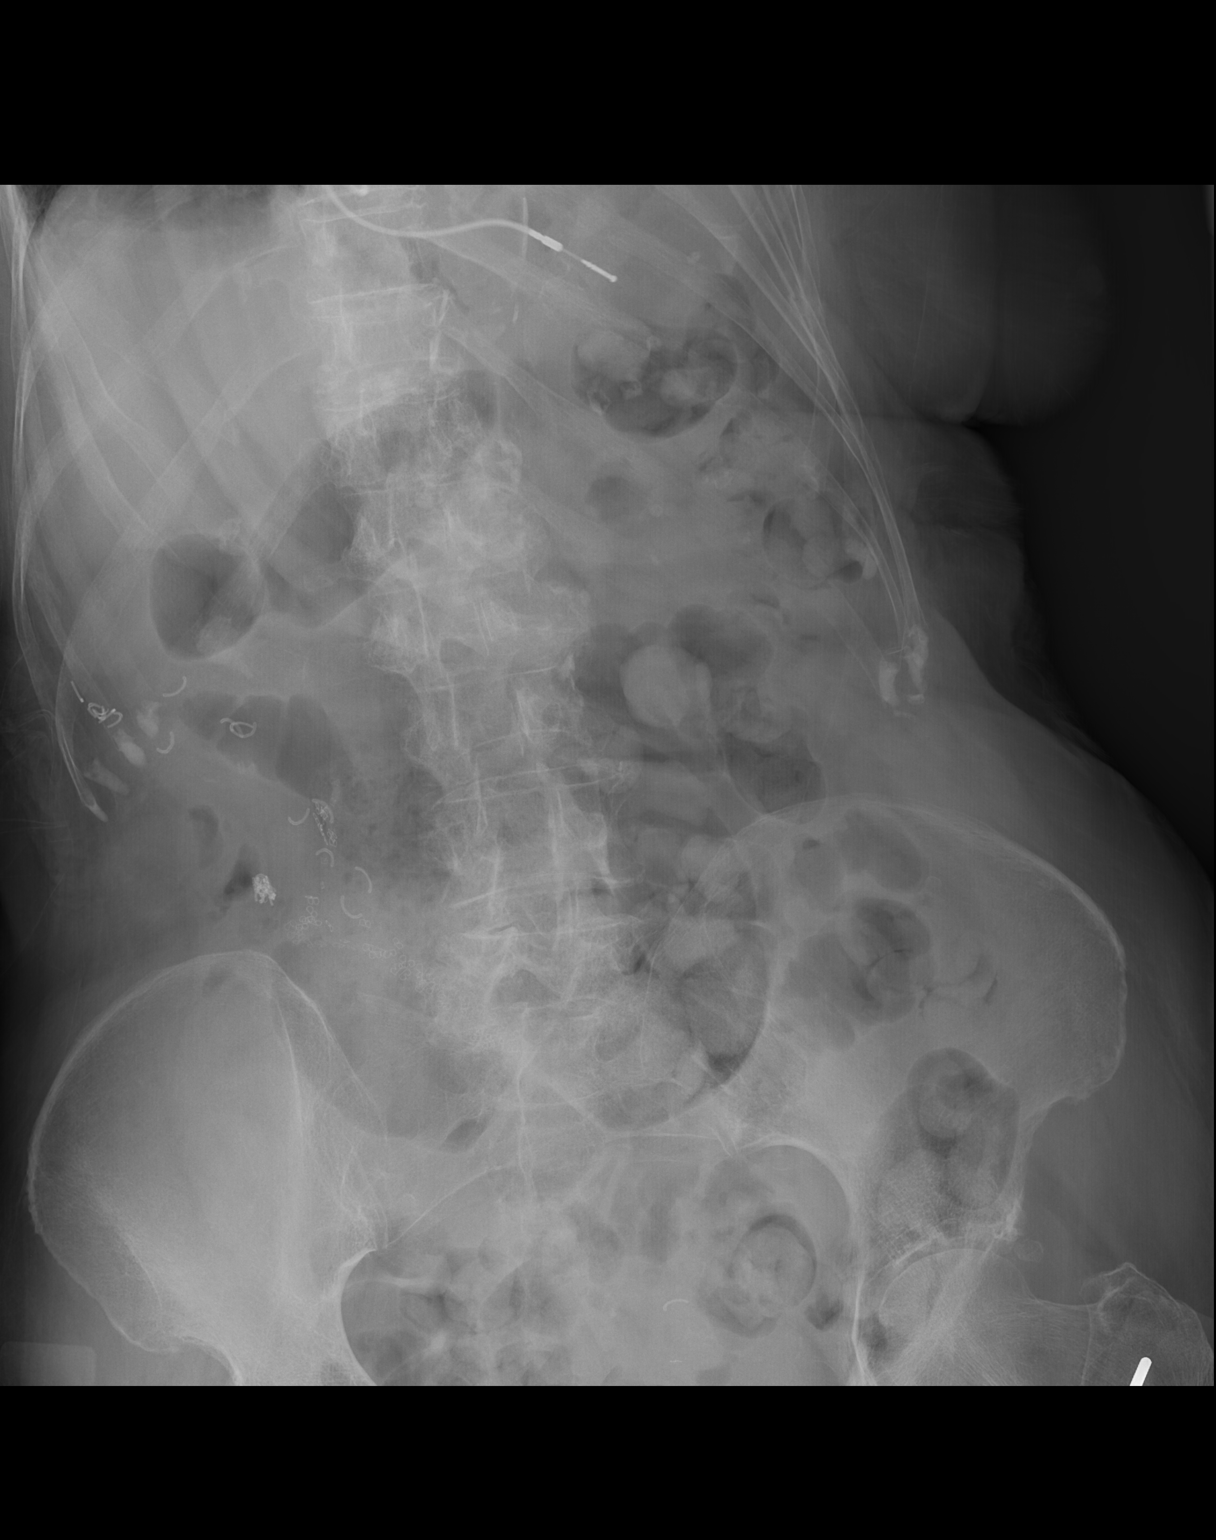

[2 of 2 positions shown; findings below may reference images not displayed]

PROCEDURE:     DXR - DXR KIDNEY URETER BLADDER  - April 30, 2012  [DATE]

RESULT:     A pacemaker leads are present. Air and fecal material is
scattered through the colon to the rectum. Surgical clips are seen in the
right midabdomen. Prominent atherosclerotic calcification is present. Bony
structures appear intact. No definite bowel obstruction is seen.
IMPRESSION: Moderate amount of retained fecal material in the colon. No
evidence of definite fecal impaction or obstruction.

[REDACTED]

## 2014-12-19 NOTE — H&P (Signed)
PATIENT NAME:  Brooke [name], Brooke [name] MR#:  [id number] DATE OF BIRTH:  07/24/1925  DATE OF ADMISSION:  04/28/2012  PRIMARY CARE PHYSICIAN: Dr. Loura Pardon  REFERRING PHYSICIAN: Dr. Ponciano Ort   CHIEF COMPLAINT: Chest pain.   HISTORY OF PRESENT ILLNESS: This is an 81 year old female who lives at assisted living facility who presents with complaint of chest pain, has significant past medical history of heart block status post permanent pacemaker, history of hyperlipidemia, hypertension. Patient lives at assisted living facility. Patient reports she started to have chest pain when she woke up this a.m. She describes the pain as a throbbing sensation in her upper chest area. As well she reports it was accompanied by shortness of breath. Patient had negative troponins x2 in ED but she had mildly elevated d-dimer so she had CT chest to rule out PE which her CAT scan was negative for pulmonary embolus but it did show some few scattered tiny right micro nodules, as well chronic obstructive pulmonary disease. As well patient has been complaining of dysuria and polyuria where she was found to have positive urinalysis where she was started on IV Cipro. Patient is a very poor historian where most of the history was obtained from the son who is at bedside and who reports his mother has been having progressive decline of her functional capacity, as well she had multiple falls recently where he requested her to be evaluated for placement at skilled nursing facility.   PAST MEDICAL HISTORY:  1. History of subarachnoid hemorrhage.  2. History of colon surgery status post colon cancer in 2001, chemotherapy as well radiation.  3. Apparent remote stroke without residual.  4. History of complete heart block status post pacemaker placement.  5. Hyperlipidemia. History of atrial fibrillation.  6. History of squamous skin cancer, status post resection.   FAMILY HISTORY: Significant for coronary artery disease.   SOCIAL  HISTORY: No history of alcohol, tobacco or illicit drug use.   ALLERGIES: Allergic to penicillin, sulfa.   HOME MEDICATIONS:  1. Synthroid 50 mcg oral daily.  2. Metoprolol succinate 12.5 mg daily.  3. Meclizine 25 mg 2 times a day as needed for dizziness.  4. Hydrochlorothiazide 25 mg daily.  5. Aspirin 81 mg daily.   REVIEW OF SYSTEMS: CONSTITUTIONAL: Patient is mildly confused but she denies any fever, fatigue, weakness. EYES: Denies blurry vision, double vision, or eye pain. ENT: Denies any tinnitus, ear pain. Has baseline hearing loss, using hearing aid. RESPIRATORY: Denies any cough, wheezing, hemoptysis. Has mild complaint of shortness of breath. CARDIOVASCULAR: Complains of chest pain. Denies any edema, arrhythmia, palpitations, syncope, altered mental status. GASTROINTESTINAL: Denies any nausea, vomiting, diarrhea. Has complaints of constipation. GENITOURINARY: Had complaints of dysuria, polyuria. ENDO: Denies any polydipsia. Has complaints of polyuria. Has hypothyroidism. Denies any heat or cold intolerance. HEMATOLOGY: Denies anemia, easy bruising, bleeding diathesis. INTEGUMENTARY: Denies any acne or rash. MUSCULOSKELETAL: Has complaints of lower back pain and muscle spasm. Denies any swelling, gout. NEUROLOGIC: Denies any numbness, weakness, dysarthria, epilepsy, tremors. PSYCHIATRIC: Denies anxiety, schizophrenia, nervousness or insomnia.    PHYSICAL EXAMINATION:  VITAL SIGNS: Temperature 97.5, pulse 73, respiratory rate 20, blood pressure 132/73, saturating 95% on oxygen.   GENERAL: Frail, elderly female looks comfortable in bed in no apparent distress.   HEENT: Head atraumatic, normocephalic. Pupils equal, reactive to light. Pink conjunctivae. Anicteric sclerae. Moist oral mucosa.   NECK: Supple. No thyromegaly. No JVD.   CHEST: Good air entry bilaterally. No wheezing, rales, rhonchi.   CARDIOVASCULAR:  S1, S2 heard. No rubs, murmur, gallops.   ABDOMEN: Soft, nontender,  nondistended. Bowel sounds present.   EXTREMITIES: No edema. No clubbing. No cyanosis.   PSYCHIATRIC: Patient is mildly confused, awake, alert x2.   NEUROLOGIC: Cranial nerves grossly intact. Motor 5/5. Sensation symmetrical.   SKIN: Has chronic lower extremity skin changes and dry skin.   LABORATORY, DIAGNOSTIC AND RADIOLOGICAL DATA: Glucose 104, BUN 19, creatinine 0.94, sodium 133, potassium 4, chloride 97, CO2 31, lipase 458. Troponin 0.02, repeat 0.03. Urinalysis showing +2 leukocyte esterase and 14 white blood cells. D-dimer 0.59, white blood cell 5.4, hemoglobin 11.8, hematocrit 34.7, platelets 291.   Ultrasound abdomen showing distended gallbladder without stones or wall thickening.   CT chest showing chronic obstructive pulmonary disease with bilateral atelectasis or scarring and few scattered tiny right micro nodules, no PE.   ASSESSMENT AND PLAN:  1. Chest pain. Appears to be atypical. Patient will be admitted to cycle three sets of cardiac enzymes to rule out acute coronary syndrome. She is already on aspirin and beta blockers. Will consult Dr. Nehemiah Massed as he was contacted by ED physician. As well there might be some dysphagia given her presentation. Will have swallow service evaluate the patient in a.m.  2. Urinary tract infection. Will continue patient on Cipro. Follow urine culture.  3. Incidental finding of CT micro nodules at the lung bases on the CAT scan. Will request in-house radiology to compare to previous CAT scans done to see if these are new or old nodules and if they are new nodules will consult pulmonary service.  4. Constipation. Will continue patient on laxative.  5. Social. Will have case management and physical therapy follow the patient for possible need for placement at skilled nursing facility.  6. Hypertension, acceptable. Continue with hydrochlorothiazide and metoprolol.  7. Mildly elevated lipase. Patient denies any abdominal pain, nausea, vomiting. It is  borderline elevated. Will recheck level in a.m. after hydration.  8. CODE STATUS: FULL CODE.      TOTAL TIME SPENT ON ADMISSION AND PATIENT CARE: 50 minutes.   ____________________________ Albertine Patricia, MD dse:cms D: 04/28/2012 01:23:24 ET T: 04/28/2012 07:00:53 ET JOB#: [id number]  cc: Albertine Patricia, MD, <Dictator> Marne A. Glori Bickers, MD Terik Haughey Graciela Husbands MD ELECTRONICALLY SIGNED 04/28/2012 22:36

## 2014-12-19 NOTE — Discharge Summary (Signed)
PATIENT NAME:  Brooke [name], DUNLEAVY MR#:  [id number] DATE OF BIRTH:  [birth year]  DATE OF ADMISSION:  04/28/2012 DATE OF DISCHARGE:  05/01/2012  DISCHARGE DISPOSITION: To skilled nursing facility.   DISCHARGE DIAGNOSES:  1. Acute T7 compression fracture with radiating thoracic pain to the chest.  2. Noncardiac chest pain.   CONSULTANTS:  1. Earnestine Leys, MD - Orthopedics. 2. Cardiology.   IMAGING STUDIES: Bone scan showed acute/subacute T7 compression fracture.   ADMITTING HISTORY AND PHYSICAL: Please see detailed history and physical dictated previously. In brief, the patient was admitted to the hospitalist service after presenting to the emergency room complaining of back pain and bilateral lower chest pain.   HOSPITAL COURSE: Acute/subacute T7 compression fracture. The patient was seen by cardiology who suggested noncardiac origin for pain after which she had a bone scan done which showed acute/subacute T7 compression fracture. Dr. Sabra Heck of orthopedics was consulted for the same who suggested to continue medical management and physical therapy. Surgery was not planned and the patient is being discharged back to her skilled nursing facility in a stable condition. The patient is on both narcotic pain medications and Lidoderm patch. Pain is well controlled at this time.   On the day of discharge, the patient's blood pressure is 111/62, pulse 68, saturating 98% on room air, and is afebrile with some minimal spinal tenderness over the T7 area and is being discharged to her skilled nursing facility in stable condition for further physical therapy.   DISCHARGE MEDICATIONS: Please see discharge instructions.   DISCHARGE INSTRUCTIONS: The patient will be on a regular diet with activity as tolerated with assistance at all times. The patient is to return to the emergency room if she has any other worsening pain or focal neurological deficits.   TIME SPENT: Time spent on the day of discharge, on discharge  dictation and coordination of discharge was 38 minutes.  ____________________________ Leia Alf Aija Scarfo, MD srs:slb D: 05/01/2012 14:39:36 ET T: 05/01/2012 14:47:10 ET JOB#: [id number]  cc: Alveta Heimlich R. Anise Harbin, MD, <Dictator> Neita Carp MD ELECTRONICALLY SIGNED 05/01/2012 15:52

## 2014-12-19 NOTE — Consult Note (Signed)
PATIENT NAME:  Brooke [name], Brooke [name] MR#:  [id number] DATE OF BIRTH:  04/26/1925  DATE OF CONSULTATION:  04/28/2012  REFERRING PHYSICIAN:   CONSULTING PHYSICIAN:  Isaias Cowman, MD  PRIMARY CARE PHYSICIAN: Dr. Loura Pardon   CHIEF COMPLAINT: "My heart is beautiful."   REASON FOR CONSULTATION: The patient is an 81 year old female referred for evaluation of chest pain.   HISTORY OF PRESENT ILLNESS: The patient is an 81 year old female who presents when she was hanging something up in her closet and developed a throbbing sensation in her upper back. She presented to [facility] Emergency Room where EKG was nondiagnostic revealing atrial pacing with ventricular sensing. Patient has mildly elevated d-dimer and CT scan was negative. Patient has ruled out for myocardial infarction by CPK isoenzymes and troponin. Patient did have a positive urinalysis and treated with intravenous ciprofloxacin. Patient currently denies chest pain.   PAST MEDICAL HISTORY:  1. Status post dual-chamber pacemaker for complete heart block.  2. Hyperlipidemia.  3. Atrial fibrillation.  4. History of subarachnoid hemorrhage.  5. History of surgery for colon cancer 2001.   MEDICATIONS:  1. Aspirin 81 mg daily.  2. HCTZ 25 mg daily.  3. Metoprolol succinate 12.5 mg daily.  4. Synthroid 50 mcg daily.  5. Meclizine 25 mg 2 tabs p.r.n. dizziness.   SOCIAL HISTORY: Patient currently lives alone in an apartment.   FAMILY HISTORY: No immediate family history of coronary disease or myocardial infarction.  REVIEW OF SYSTEMS: CONSTITUTIONAL: No fever or chills. EYES: No blurry vision. EARS: No hearing loss. RESPIRATORY: No shortness of breath. CARDIOVASCULAR: Patient denies chest pain. GASTROINTESTINAL: No nausea, vomiting, diarrhea or constipation. GENITOURINARY: No dysuria or hematuria. ENDOCRINE: No polyuria or polydipsia. HEMATOLOGICAL: No easy bruising or bleeding. INTEGUMENTARY: No rash. MUSCULOSKELETAL: No arthralgias or myalgias.  NEUROLOGICAL: No focal muscle weakness or numbness. PSYCHOLOGICAL: No depression or anxiety.   PHYSICAL EXAMINATION:  VITAL SIGNS: Blood pressure 112/65, pulse 74, respirations 20, temperature 97.5, pulse oximetry 97%.   HEENT: Pupils equal, reactive to light and accommodation.   NECK: Supple without thyromegaly.   LUNGS: Clear.   CARDIOVASCULAR: Normal jugular venous pressure. Normal point of maximal impulse. Regular rate and rhythm. Normal S1, S2. No appreciable gallop, murmur, rub.   ABDOMEN: Soft and nontender. Pulses were intact bilaterally.   MUSCULOSKELETAL: Normal muscle tone.   NEUROLOGIC: Patient is alert and oriented x3. Motor and sensory both grossly intact.   IMPRESSION: 81 year old female status post dual-chamber pacemaker for complete heart block who presents with chest discomfort with atypical features with negative troponin, currently denies chest pain.   RECOMMENDATIONS:  1. Agree with overall current therapy.  2. Would defer full dose anticoagulation.  3. Would defer further noninvasive or invasive cardiac evaluation at this time.  ____________________________ Isaias Cowman, MD ap:cms D: 04/28/2012 12:54:33 ET T: 04/28/2012 14:01:55 ET  JOB#: [id number] Sheppard Coil Jacoby Ritsema MD ELECTRONICALLY SIGNED 05/05/2012 9:00

## 2014-12-19 NOTE — Consult Note (Signed)
Brief Consult Note: Diagnosis: CP, atypical, neg ECG, neg trop.   Patient was seen by consultant.   Consult note dictated.   Comments: REC  Agree with current therapy, defer full dose anticoagulation, defer further cardiac diagnostics at this time.  Electronic Signatures: Isaias Cowman (MD)  (Signed 28-Aug-13 12:56)  Authored: Brief Consult Note   Last Updated: 28-Aug-13 12:56 by Isaias Cowman (MD)

## 2014-12-19 NOTE — Consult Note (Signed)
Brief Consult Note: Diagnosis: Chronic back pain and osteoporosis.   Patient was seen by consultant.   Comments: 81 year old female fell 2 weeks ago and complains of some back pain.  Admitted [facility] for chest pain this week. Says her back is feeling better and is "not a bother".   Exam:  alert and comfortable.  sitting up as best she can with significant thoracic kyphosis.  circulation/sensation/motor function good distally.  No back pain to palpation. X-rays: severe osteoporosis with several old thoracic compression fractures   Rx: symptomatic care only.       Physical Therapy for mobility       Spoke to her son and told him I did not recommend any further intervention.  Electronic Signatures: Park Breed (MD)  (Signed 31-Aug-13 14:21)  Authored: Brief Consult Note   Last Updated: 31-Aug-13 14:21 by Park Breed (MD)

## 2014-12-19 NOTE — Consult Note (Signed)
81 year old female referred for evaluation of thoracic compression fractures. and xrays reviewed. has moderate compression fractures at T12 and L1 which as noted by the radiologist are unchanged from previous xray in 2011 and therefore are chronic. this patient is indeed having significant back pain, my personal bias would be to consult physical therapy to apply a soft lumbosacral corset. I doubt that efforts at kyphoplasty are indicated or would be helpful in this patient. If back pain is significant and unrelenting and a major problem, then I would proceed with an MRI and perhaps consult Dr Rudene Christians for possible kyphoplasty evaluation if the MRI should show an acute fracture.  Electronic Signatures: Lucas Mallow (MD)  (Signed on 29-Aug-13 06:42)  Authored  Last Updated: 29-Aug-13 06:42 by Lucas Mallow (MD)
